# Patient Record
Sex: Male | Born: 1955 | Race: White | Hispanic: Yes | Marital: Married | Smoking: Never smoker
Health system: Southern US, Community
[De-identification: ages and names within clinical notes are randomized; demographics above are authoritative.]

## PROBLEM LIST (undated history)

## (undated) DIAGNOSIS — I10 Essential (primary) hypertension: Secondary | ICD-10-CM

## (undated) DIAGNOSIS — IMO0002 Reserved for concepts with insufficient information to code with codable children: Secondary | ICD-10-CM

## (undated) DIAGNOSIS — I517 Cardiomegaly: Secondary | ICD-10-CM

## (undated) DIAGNOSIS — R229 Localized swelling, mass and lump, unspecified: Secondary | ICD-10-CM

## (undated) DIAGNOSIS — R600 Localized edema: Secondary | ICD-10-CM

## (undated) DIAGNOSIS — M359 Systemic involvement of connective tissue, unspecified: Secondary | ICD-10-CM

## (undated) HISTORY — PX: NO PAST SURGERIES: SHX2092

## (undated) HISTORY — DX: Cardiomegaly: I51.7

## (undated) SURGERY — LAPAROSCOPIC CHOLECYSTECTOMY
Anesthesia: General

---

## 2003-10-08 ENCOUNTER — Emergency Department (HOSPITAL_COMMUNITY): Admission: EM | Admit: 2003-10-08 | Discharge: 2003-10-08 | Payer: Self-pay | Admitting: Emergency Medicine

## 2003-10-11 ENCOUNTER — Inpatient Hospital Stay (HOSPITAL_COMMUNITY): Admission: AD | Admit: 2003-10-11 | Discharge: 2003-10-14 | Payer: Self-pay | Admitting: General Surgery

## 2008-02-10 ENCOUNTER — Ambulatory Visit: Payer: Self-pay | Admitting: Cardiology

## 2010-11-02 NOTE — Procedures (Signed)
NAME:  George Douglas, George Douglas                   ACCOUNT NO.:  [id number]   MEDICAL RECORD NO.:  [id number]                   PATIENT TYPE:  INP   LOCATION:  A212                                 FACILITY:  [facility]   PHYSICIAN:  Vida Roller, M.D.                DATE OF BIRTH:  1955/12/17   DATE OF PROCEDURE:  DATE OF DISCHARGE:                                    STRESS TEST   INDICATION:  Mr. Lelon Perla is a 55 year old male with no known coronary  artery disease, atypical chest discomfort, and negative cardiac enzymes x3  for acute myocardial infarction.  He recently underwent treadmill test which  was a suboptimal test and revealed no ischemia, and good exercise tolerance.  He is having this test done for further evaluation.   BASELINE DATA:  EKG revealed sinus rhythm at 61 beats per minute with  nonspecific ST abnormalities.  Blood pressure 130/70.   DESCRIPTION OF PROCEDURE:  Adenosine __________ mg was infused over four  minute protocol with Cardiolite injected at three minutes.  The patient  reported chest discomfort which resolved in recovery.  EKG revealed no  ischemia and no arrhythmias.   FINAL IMAGES AND RESULTS:  Pending M.D. review.   The patient was consented using the [facility] hot line.     ________________________________________  ___________________________________________  Jae Dire, P.A. [facility]                      Vida Roller, M.D.   AB/MEDQ  D:  10/14/2003  T:  10/14/2003  Job:  [id number]

## 2010-11-02 NOTE — H&P (Signed)
NAME:  George Douglas, George Douglas                   ACCOUNT NO.:  [id number]   MEDICAL RECORD NO.:  [id number]                   PATIENT TYPE:  INP   LOCATION:  A212                                 FACILITY:  [facility]   PHYSICIAN:  Dirk Dress. Katrinka Blazing, M.D.                DATE OF BIRTH:  1955-08-28   DATE OF ADMISSION:  10/11/2003  DATE OF DISCHARGE:                                HISTORY & PHYSICAL   HISTORY OF PRESENT ILLNESS:  A 55 year old married [place] male admitted for  evaluation of chest pain.  The patient has greater than a six month history  of chest pain.  He describes this as episodes of pressure that occur at  varying times.  He does not have diaphoresis, nausea, nor does he have  shortness of breath.  The pain is not made worse with exertion.  The  severity of the pain is not more intense or any more frequent over the past  six months.  He does have associated weakness in his hips and legs which  occurs whenever the pressure-like sensation occurs.  The pain has occurred  daily over the last two days, and it last six hours two days ago, it lasted  about 2-3 hours yesterday.   The patient presented to the office after evaluation in the emergency room.  His main concern is that he has cancer because of his weakness.  There is no  history of weight loss.  He is not anorexic.  He has not had any change in  bowel habits.  He has not noted any increasing lymph nodes, swelling or  tenderness of his muscles.  Because of the concern of possible angina, the  patient is admitted.  There is difficulty because of poor interpretation  because he speaks no [place], and all information is obtained through his  son who serves as an Equities trader.   He has not had any known medical illness. He rarely sees a physician.  He  has only been in the [place] for about six months.  He did see a  physician in [place] and was treated for elevated cholesterol.  He is not on  any chronic medication.  He has  had no known illness as a child.  He has had  no previous hospitalizations.  He has had no surgery.  His only medication  is over-the-counter Tylenol.   ALLERGIES:  There is no history of allergies.   SOCIAL HISTORY:  He is married.  [place]  working as an Cytogeneticist.  He resides with his son who speaks  [place] fairly well.  He does not smoke or use street drugs.  He has a  history of alcohol abuse in the past, but he has used very little alcohol in  the past four years, and he has used none in the past year.  He is the  father of seven children,  which consists of four sons and three daughters,  all of whom are healthy.   FAMILY HISTORY:  His father died at age 69 of nasal infection.  Mother died  at age 61 of natural causes.  He has six siblings, all of whom he feels are  in good health.  There is no known history of heart disease in any of his  siblings.   REVIEW OF SYSTEMS:  Unremarkable except for fear of cancer, and some  numbness and tingling of his legs.  He denies chest pain, shortness of  breath, diarrhea, constipation, headaches or dizziness.   PHYSICAL EXAMINATION:  GENERAL:  He is a healthy-appearing, Hispanic male,  in no acute distress.  VITAL SIGNS:  Blood pressure 115/71, pulse 68, respirations 18, temperature  97.6.  HEENT:  Pupils are equal and reactive.  Extraocular movements are full.  Oral mucosa is normal.  NECK:  Supple, no JVD, bruits or adenopathy.  CHEST:  Clear to auscultation, no rales, rubs, rhonchi or wheezes.  HEART:  Regular rate and rhythm, without murmur, gallop or rub.  ABDOMEN:  Mildly obese, soft, nontender except for suggestion of tenderness  in the deep right lower quadrant.  No masses are felt.  EXTREMITIES:  Good femoral, popliteal, dorsalis pedis and posterior tibial  pulses.  Very good capillary refill.  No skin changes in his feet or legs.  There is no loss of hair in the distal aspect of his  legs.  NEUROLOGIC:  On exam, he is alert and oriented, Cranial nerves are intact.  He has symmetric motor and sensory in all extremities.  Cerebellar appears  to be intact.   IMPRESSION:  1. Left chest pain, recurrent with crescendo-type pattern, etiology     undetermined, rule out unstable angina.  2. Diffuse weakness including lower-extremity weakness which may be related     to low-back discomfort.  This will have to be evaluated.  There is no     evidence of any clinical peripheral vascular disease.  There is no     history to suggest claudication.  3. Questionable history of hyperlipidemia, to be evaluated.   PLAN:  1. The patient will be evaluated by cardiology.  A stress test will be done.     If this is negative for ischemia, we will do the remaining portion of the     workup as an outpatient, and he will be treated symptomatically.  2. Thyroid functions have been ordered to try to evaluate him for what     appears to be fatigue.  3. We will also proceed with x-rays of his lumbar spine, looking for     questionable lumbar disk disease which may explain his buttocks and upper     leg discomfort.     ___________________________________________                                         Dirk Dress. Katrinka Blazing, M.D.   LCS/MEDQ  D:  10/11/2003  T:  10/11/2003  Job:  [id number]

## 2010-11-02 NOTE — Procedures (Signed)
NAME:  George Douglas, George Douglas                   ACCOUNT NO.:  [id number]   MEDICAL RECORD NO.:  [id number]                   PATIENT TYPE:  INP   LOCATION:  A212                                 FACILITY:  [facility]   PHYSICIAN:  [place] Bing, M.D.               DATE OF BIRTH:  Jan 10, 1956   DATE OF PROCEDURE:  10/12/2003  DATE OF DISCHARGE:                                    STRESS TEST   PROCEDURE:  Graded exercise test.   REFERRING PHYSICIAN:  Dirk Dress. Katrinka Blazing, M.D.   CLINICAL DATA:  A 55 year old gentleman admitted to hospital with atypical  chest pain; a few cardiovascular risk factors.  1. Upright treadmill exercise to a workload of 12 METs and a heart rate of     135, 79% of age-predicted maximum.  Exercise discontinued due to fatigue.     No chest pain reported.  2. Blood pressure increased from a resting value of 120/75 to 180/80 at peak     exercise, a normal response.  3. No important arrhythmias--a few PACs.  4. Baseline EKG:  Normal sinus rhythm; within normal limits.   STRESS EKG:  Insignificant upsloping ST segment depression.   IMPRESSION:  Negative, albeit somewhat maximal, stress test revealing good  exercise tolerance.  No exercise--induced chest discomfort and no  electrocardiographic changes to suggest myocardial ischemia.  Other findings  as noted.      ___________________________________________                                            [place] Bing, M.D.   RR/MEDQ  D:  10/12/2003  T:  10/13/2003  Job:  [id number]

## 2010-11-02 NOTE — Discharge Summary (Signed)
NAME:  George Douglas, George Douglas                   ACCOUNT NO.:  [id number]   MEDICAL RECORD NO.:  [id number]                   PATIENT TYPE:  INP   LOCATION:  A212                                 FACILITY:  [facility]   PHYSICIAN:  Dirk Dress. Katrinka Blazing, M.D.                DATE OF BIRTH:  [birth year]   DATE OF ADMISSION:  10/11/2003  DATE OF DISCHARGE:  10/14/2003                                 DISCHARGE SUMMARY   DISCHARGE DIAGNOSES:  1. Atypical chest pain.  2. Chronic low back pain.   DISPOSITION:  The patient is discharged home in stable, satisfactory  condition.   DISCHARGE MEDICATIONS:  1. Ultram 50 mg q.i.d. p.r.n. back pain.  2. Aspirin 81 mg q.d.   The patient is scheduled to be seen in the office one week post discharge.   SUMMARY:  This is a 55 year old Hispanic male who is admitted for evaluation  of chest pain.  He gives greater than a six month history of pain, which he  describes as episodes of pressure that occur at varying times.  The pain is  not made worse with exertion.  The severity of the pain is not more intense  or any more frequent over the past six months.  He has weakness in his hips  and legs.  Because of concern of possible angina, the patient is admitted.  Other history is given in the admission note.  It was felt that the patient  had atypical chest pain and mild low back pain, but because of language  difficulties with inability to fully understand, it was felt that it would  be in his best interest that he be admitted and evaluated.  Cardiac enzymes  were negative for myocardial ischemia or injury times three.  Graded  exercise stress test was negative for ischemia.  Adenosine stress Cardiolite  was negative with normal perfusion and normal left ventricular ejection  fraction.  It was felt that he had no evidence of coronary artery disease.  He was seen in consultation by cardiology.  The patient was therefore  discharged home and advised to have regular  follow-up in the office.     ___________________________________________                                         Dirk Dress. Katrinka Blazing, M.D.   LCS/MEDQ  D:  11/20/2003  T:  11/21/2003  Job:  [id number]

## 2014-08-02 ENCOUNTER — Emergency Department: Payer: Self-pay | Admitting: Emergency Medicine

## 2017-01-01 ENCOUNTER — Other Ambulatory Visit: Payer: Self-pay | Admitting: Family Medicine

## 2017-01-01 DIAGNOSIS — R221 Localized swelling, mass and lump, neck: Secondary | ICD-10-CM

## 2017-01-03 ENCOUNTER — Ambulatory Visit
Admission: RE | Admit: 2017-01-03 | Discharge: 2017-01-03 | Disposition: A | Discharge status: Home / Self Care | Payer: BLUE CROSS/BLUE SHIELD | Source: Ambulatory Visit | Attending: Family Medicine | Admitting: Family Medicine

## 2017-01-03 DIAGNOSIS — R221 Localized swelling, mass and lump, neck: Secondary | ICD-10-CM | POA: Diagnosis not present

## 2017-01-09 ENCOUNTER — Other Ambulatory Visit: Payer: Self-pay | Admitting: Family Medicine

## 2017-01-09 DIAGNOSIS — R221 Localized swelling, mass and lump, neck: Secondary | ICD-10-CM

## 2017-01-16 ENCOUNTER — Ambulatory Visit
Admission: RE | Admit: 2017-01-16 | Discharge: 2017-01-16 | Disposition: A | Discharge status: Home / Self Care | Payer: BLUE CROSS/BLUE SHIELD | Source: Ambulatory Visit | Attending: Family Medicine | Admitting: Family Medicine

## 2017-01-16 DIAGNOSIS — R221 Localized swelling, mass and lump, neck: Secondary | ICD-10-CM | POA: Diagnosis not present

## 2017-01-16 HISTORY — DX: Systemic involvement of connective tissue, unspecified: M35.9

## 2017-01-16 LAB — POCT I-STAT CREATININE: CREATININE: 0.6 mg/dL — AB (ref 0.61–1.24)

## 2017-01-16 MED ORDER — IOPAMIDOL (ISOVUE-370) INJECTION 76%
75.0000 mL | Freq: Once | INTRAVENOUS | Status: AC | PRN
  Administered 2017-01-16: 75 mL via INTRAVENOUS

## 2017-02-03 ENCOUNTER — Emergency Department: Payer: BLUE CROSS/BLUE SHIELD

## 2017-02-03 ENCOUNTER — Encounter: Payer: Self-pay | Admitting: Emergency Medicine

## 2017-02-03 ENCOUNTER — Emergency Department
Admission: EM | Admit: 2017-02-03 | Discharge: 2017-02-04 | Disposition: A | Discharge status: Home / Self Care | Payer: BLUE CROSS/BLUE SHIELD | Attending: Emergency Medicine | Admitting: Emergency Medicine

## 2017-02-03 DIAGNOSIS — K429 Umbilical hernia without obstruction or gangrene: Secondary | ICD-10-CM | POA: Diagnosis not present

## 2017-02-03 DIAGNOSIS — K8020 Calculus of gallbladder without cholecystitis without obstruction: Secondary | ICD-10-CM

## 2017-02-03 DIAGNOSIS — R109 Unspecified abdominal pain: Secondary | ICD-10-CM | POA: Diagnosis present

## 2017-02-03 DIAGNOSIS — I10 Essential (primary) hypertension: Secondary | ICD-10-CM | POA: Diagnosis not present

## 2017-02-03 HISTORY — DX: Essential (primary) hypertension: I10

## 2017-02-03 LAB — CBC
HCT: 43.4 % (ref 40.0–52.0)
Hemoglobin: 14.9 g/dL (ref 13.0–18.0)
MCH: 31.9 pg (ref 26.0–34.0)
MCHC: 34.3 g/dL (ref 32.0–36.0)
MCV: 92.9 fL (ref 80.0–100.0)
PLATELETS: 164 10*3/uL (ref 150–440)
RBC: 4.68 MIL/uL (ref 4.40–5.90)
RDW: 13.9 % (ref 11.5–14.5)
WBC: 5.4 10*3/uL (ref 3.8–10.6)

## 2017-02-03 LAB — COMPREHENSIVE METABOLIC PANEL
ALT: 18 U/L (ref 17–63)
AST: 25 U/L (ref 15–41)
Albumin: 4 g/dL (ref 3.5–5.0)
Alkaline Phosphatase: 68 U/L (ref 38–126)
Anion gap: 9 (ref 5–15)
BUN: 9 mg/dL (ref 6–20)
CHLORIDE: 102 mmol/L (ref 101–111)
CO2: 29 mmol/L (ref 22–32)
CREATININE: 0.77 mg/dL (ref 0.61–1.24)
Calcium: 9.2 mg/dL (ref 8.9–10.3)
GFR calc Af Amer: >60 mL/min (ref >60)
GLUCOSE: 97 mg/dL (ref 65–99)
Potassium: 3.9 mmol/L (ref 3.5–5.1)
Sodium: 140 mmol/L (ref 135–145)
Total Bilirubin: 0.7 mg/dL (ref 0.3–1.2)
Total Protein: 7.7 g/dL (ref 6.5–8.1)

## 2017-02-03 LAB — LIPASE, BLOOD: LIPASE: 26 U/L (ref 11–51)

## 2017-02-03 MED ORDER — ONDANSETRON HCL 4 MG/2ML IJ SOLN
4.0000 mg | Freq: Once | INTRAMUSCULAR | Status: AC
  Administered 2017-02-03: 4 mg via INTRAVENOUS
  Filled 2017-02-03: qty 2

## 2017-02-03 MED ORDER — IOPAMIDOL (ISOVUE-300) INJECTION 61%
100.0000 mL | Freq: Once | INTRAVENOUS | Status: AC | PRN
  Administered 2017-02-04: 100 mL via INTRAVENOUS

## 2017-02-03 MED ORDER — MORPHINE SULFATE (PF) 4 MG/ML IV SOLN
4.0000 mg | Freq: Once | INTRAVENOUS | Status: AC
  Administered 2017-02-03: 4 mg via INTRAVENOUS
  Filled 2017-02-03: qty 1

## 2017-02-03 MED ORDER — SODIUM CHLORIDE 0.9 % IV BOLUS (SEPSIS)
1000.0000 mL | Freq: Once | INTRAVENOUS | Status: AC
  Administered 2017-02-04: 1000 mL via INTRAVENOUS

## 2017-02-03 NOTE — ED Triage Notes (Signed)
Patient reports abdominal pain above umbilicus for years, but pain worse today.  Reports history of hernia.  Denies nausea or vomiting.

## 2017-02-03 NOTE — ED Provider Notes (Addendum)
[facility] Emergency Department Provider Note   ____________________________________________   First MD Initiated Contact with Patient 02/03/17 2329     (approximate)  I have reviewed the triage vital signs and the nursing notes.   HISTORY  Chief Complaint Abdominal Pain    HPI George Douglas is a 61 y.o. male who comes into the hospital today with some abdominal pain. The patient reports that he has a hernia in his abdomen and is currently painful. He reports it started earlier today. The patient denies any nausea vomiting or fever he also denies any diarrhea or constipation. He didn't take anything for pain and rates his pain a 10 out of 10 in intensity. He reports that he's had pain in this area in the past but today is just very severe. The patient denies any chest pain or shortness of breath. He didn't take his blood pressure medicines today. He couldn't tolerate the pain so he decided to come into the hospital for evaluation. He also reports that his right eye has been sick over the last 2 days.   Past Medical History:  Diagnosis Date  . Collagen vascular disease ([place])   . Hypertension     There are no active problems to display for this patient.   History reviewed. No pertinent surgical history.  Prior to Admission medications   Medication Sig Start Date End Date Taking? Authorizing Provider  ondansetron (ZOFRAN ODT) 4 MG disintegrating tablet Take 1 tablet (4 mg total) by mouth every 8 (eight) hours as needed for nausea or vomiting. 02/04/17   Loney Hering, MD  oxyCODONE-acetaminophen (ROXICET) 5-325 MG tablet Take 1 tablet by mouth every 6 (six) hours as needed. 02/04/17   Loney Hering, MD    Allergies Patient has no known allergies.  No family history on file.  Social History Social History  Substance Use Topics  . Smoking status: Never Smoker  . Smokeless tobacco: Not on file  . Alcohol use Yes    Review of  Systems  Constitutional: No fever/chills Eyes: right eye redness ENT: No sore throat. Cardiovascular: Denies chest pain. Respiratory: Denies shortness of breath. Gastrointestinal: abdominal pain.  No nausea, no vomiting.  No diarrhea.  No constipation. Genitourinary: Negative for dysuria. Musculoskeletal: Negative for back pain. Skin: Negative for rash. Neurological: Negative for headaches, focal weakness or numbness.   ____________________________________________   PHYSICAL EXAM:  VITAL SIGNS: ED Triage Vitals  Enc Vitals Group     BP 02/03/17 1957 (!) 147/71     Pulse Rate 02/03/17 1957 63     Resp 02/03/17 1957 20     Temp 02/03/17 1957 98.1 F (36.7 C)     Temp Source 02/03/17 1957 Oral     SpO2 02/03/17 1957 94 %     Weight 02/03/17 2002 197 lb (89.4 kg)     Height 02/03/17 2002 5\' 6"  (1.676 m)     Head Circumference --      Peak Flow --      Pain Score 02/03/17 2002 10     Pain Loc --      Pain Edu? --      Excl. in [place]? --     Constitutional: Alert and oriented. Well appearing and in moderate distress. Eyes: Conjunctivae injected on right and sclera erythematous. PERRL. EOMI. Head: Atraumatic. Nose: No congestion/rhinnorhea. Mouth/Throat: Mucous membranes are moist.  Oropharynx non-erythematous. Cardiovascular: Normal rate, regular rhythm. Systolic murmur auscultated.  Good peripheral circulation. Respiratory: Normal  respiratory effort.  No retractions. Lungs CTAB. Gastrointestinal: Soft with some mid abd tenderness to palpation and an umbilical hernia that is firm and tender to palpation. The patient pushed my hand away as I palpated the area, distention. Positive bowel sounds Musculoskeletal: No lower extremity tenderness nor edema.   Neurologic:  Normal speech and language.  Skin:  Skin is warm, dry and intact.  Psychiatric: Mood and affect are normal.   ____________________________________________   LABS (all labs ordered are listed, but only abnormal  results are displayed)  Labs Reviewed  URINALYSIS, COMPLETE (UACMP) WITH MICROSCOPIC - Abnormal; Notable for the following:       Result Value   Color, Urine STRAW (*)    APPearance CLEAR (*)    Specific Gravity, Urine 1.004 (*)    All other components within normal limits  LIPASE, BLOOD  COMPREHENSIVE METABOLIC PANEL  CBC  ETHANOL  LACTIC ACID, PLASMA   ____________________________________________  EKG  ED ECG REPORT I, Loney Hering, the attending physician, personally viewed and interpreted this ECG.   Date: 02/03/2017  EKG Time: 2009  Rate: 65  Rhythm: normal sinus rhythm  Axis: normal  Intervals:none  ST&T Change: none  ____________________________________________  RADIOLOGY  Ct Abdomen Pelvis W Contrast  Result Date: 02/04/2017 CLINICAL DATA:  Abdominal distension. Acute on chronic periumbilical pain. EXAM: CT ABDOMEN AND PELVIS WITH CONTRAST TECHNIQUE: Multidetector CT imaging of the abdomen and pelvis was performed using the standard protocol following bolus administration of intravenous contrast. CONTRAST:  129mL ISOVUE-300 IOPAMIDOL (ISOVUE-300) INJECTION 61% COMPARISON:  Scrotal sonogram August 02, 2014 FINDINGS: LOWER CHEST: Pleural thickening LEFT lung base. The included heart is moderately enlarged. No pericardial effusions. HEPATOBILIARY: Numerous tiny stones within the gallbladder neck versus proximal cystic duct. No CT findings of acute cholecystitis. Normal CT liver. PANCREAS: Normal. SPLEEN: Normal. ADRENALS/URINARY TRACT: Kidneys are orthotopic, demonstrating symmetric enhancement. Focal cortical scarring RIGHT interpolar kidney. No nephrolithiasis, hydronephrosis or solid renal masses. The unopacified ureters are normal in course and caliber. Delayed imaging through the kidneys demonstrates symmetric prompt contrast excretion within the proximal urinary collecting system. Urinary bladder is partially distended and unremarkable. Normal adrenal glands.  STOMACH/BOWEL: The stomach, small and large bowel are normal in course and caliber without inflammatory changes. Normal appendix. VASCULAR/LYMPHATIC: Aortoiliac vessels are normal in course and caliber. Mild calcific atherosclerosis. No lymphadenopathy by CT size criteria. REPRODUCTIVE: Partially imaged LEFT scrotal hydrocele. Prostate size is normal. OTHER: No intraperitoneal free fluid or free air. MUSCULOSKELETAL: Nonacute. 3.2 cm neck small to moderate umbilical hernia containing fat and inflammatory changes. No bowel within hernia sac. Mild L5-S1 degenerative disc with vacuum phenomena. IMPRESSION: 1. Small to moderate fat containing umbilical hernia with fat necrosis. 2. Cholelithiasis versus choledocholithiasis without acute cholecystitis. 3. Moderate cardiomegaly.  Recommend nonemergent chest radiograph. Aortic Atherosclerosis (ICD10-I70.0). Electronically Signed   By: Elon Alas M.D.   On: 02/04/2017 00:58   US Abdomen Limited Ruq  Result Date: 02/04/2017 CLINICAL DATA:  Supraumbilical abdominal pain times several years worsening today. EXAM: ULTRASOUND ABDOMEN LIMITED RIGHT UPPER QUADRANT COMPARISON:  Same day CT of the abdomen and pelvis FINDINGS: Gallbladder: Nonobstructing gallstones are noted without secondary signs of acute cholecystitis. The largest calculus is approximate 8-9 mm. Single wall thickness of the gallbladder is normal at 2.2 mm. There is no pericholecystic. No sonographic Murphy sign noted by sonographer. Common bile duct: Diameter: Dilated up to 9 mm without evidence of choledocholithiasis. Liver: No focal lesion identified. Within normal limits in parenchymal echogenicity.  Portal vein is patent on color Doppler imaging with normal direction of blood flow towards the liver. IMPRESSION: Uncomplicated cholelithiasis. Ectatic appearance of the common bile duct without stones. Electronically Signed   By: Ashley Royalty M.D.   On: 02/04/2017 02:28     ____________________________________________   PROCEDURES  Procedure(s) performed: None  Procedures  Critical Care performed: No  ____________________________________________   INITIAL IMPRESSION / ASSESSMENT AND PLAN / ED COURSE  Pertinent labs & imaging results that were available during my care of the patient were reviewed by me and considered in my medical decision making (see chart for details).  This is a 61 year old male who comes into the hospital today with abdominal pain and hernia. The patient reports that he's been having this pain minimally on and off for some time but it's worse all day today. I will give the patient a dose of morphine and Zofran. I will also give him a liter of normal saline and I will send the patient for a CT scan of his abdomen looking for possible incarceration of his hernia. The patient's blood work is unremarkable.     The patient has a fat-containing hernia with some necrosis. He also has some gallstones. Otherwise the patient's blood work is unremarkable. I did give the patient a dose of Percocet for pain. Otherwise the patient has no other complaints. He states that his pain has some improvement. He will be discharged home to follow-up with surgery or he reports he has an appointment in [facility] where he may also attempt to follow-up. He'll be discharged to home. ____________________________________________   FINAL CLINICAL IMPRESSION(S) / ED DIAGNOSES  Final diagnoses:  Abdominal pain  Umbilical hernia without obstruction and without gangrene  Calculus of gallbladder without cholecystitis without obstruction      NEW MEDICATIONS STARTED DURING THIS VISIT:  New Prescriptions   ONDANSETRON (ZOFRAN ODT) 4 MG DISINTEGRATING TABLET    Take 1 tablet (4 mg total) by mouth every 8 (eight) hours as needed for nausea or vomiting.   OXYCODONE-ACETAMINOPHEN (ROXICET) 5-325 MG TABLET    Take 1 tablet by mouth every 6 (six) hours as  needed.     Note:  This document was prepared using Dragon voice recognition software and may include unintentional dictation errors.    Loney Hering, MD 02/04/17 0254    Loney Hering, MD 02/04/17 [phone]

## 2017-02-03 NOTE — ED Notes (Signed)
Interpreter request put in; pt denies speaking or understanding any English.

## 2017-02-04 ENCOUNTER — Emergency Department: Payer: BLUE CROSS/BLUE SHIELD

## 2017-02-04 ENCOUNTER — Telehealth: Payer: Self-pay

## 2017-02-04 LAB — URINALYSIS, COMPLETE (UACMP) WITH MICROSCOPIC
BILIRUBIN URINE: NEGATIVE
Bacteria, UA: NONE SEEN
Glucose, UA: NEGATIVE mg/dL
HGB URINE DIPSTICK: NEGATIVE
KETONES UR: NEGATIVE mg/dL
LEUKOCYTES UA: NEGATIVE
NITRITE: NEGATIVE
Protein, ur: NEGATIVE mg/dL
SPECIFIC GRAVITY, URINE: 1.004 — AB (ref 1.005–1.030)
Squamous Epithelial / LPF: NONE SEEN
pH: 7 (ref 5.0–8.0)

## 2017-02-04 LAB — LACTIC ACID, PLASMA: LACTIC ACID, VENOUS: 1.1 mmol/L (ref 0.5–1.9)

## 2017-02-04 LAB — ETHANOL

## 2017-02-04 MED ORDER — ONDANSETRON 4 MG PO TBDP: 4.0000 mg | ORAL_TABLET | Freq: Three times a day (TID) | ORAL | 0 refills | Status: DC | PRN

## 2017-02-04 MED ORDER — OXYCODONE-ACETAMINOPHEN 5-325 MG PO TABS: 1.0000 | ORAL_TABLET | Freq: Four times a day (QID) | ORAL | 0 refills | Status: DC | PRN

## 2017-02-04 MED ORDER — OXYCODONE-ACETAMINOPHEN 5-325 MG PO TABS
2.0000 | ORAL_TABLET | Freq: Once | ORAL | Status: AC
  Administered 2017-02-04: 2 via ORAL
  Filled 2017-02-04: qty 2

## 2017-02-04 NOTE — ED Notes (Signed)
Discharge instructions reviewed using [employer] Interpreter. Pt verbalized understanding of the instructions, follow up and home care and prescription medication administration.

## 2017-02-04 NOTE — ED Notes (Signed)

## 2017-02-04 NOTE — Telephone Encounter (Signed)
Called patient and left him a voicemail letting him know that we were calling to schedule him a consult with our surgeon to be seen for his cholelithiasis and umbilical hernia. Awaiting on his call.

## 2017-02-05 NOTE — Telephone Encounter (Signed)
Called patient again and left him a voicemail.

## 2017-02-10 ENCOUNTER — Other Ambulatory Visit: Payer: Self-pay

## 2017-02-12 ENCOUNTER — Ambulatory Visit (INDEPENDENT_AMBULATORY_CARE_PROVIDER_SITE_OTHER): Payer: BLUE CROSS/BLUE SHIELD | Admitting: Surgery

## 2017-02-12 VITALS — BP 136/73 | HR 77 | Temp 98.2°F | Ht 64.0 in | Wt 216.0 lb

## 2017-02-12 DIAGNOSIS — K8020 Calculus of gallbladder without cholecystitis without obstruction: Secondary | ICD-10-CM | POA: Diagnosis not present

## 2017-02-12 DIAGNOSIS — I517 Cardiomegaly: Secondary | ICD-10-CM | POA: Diagnosis not present

## 2017-02-12 DIAGNOSIS — R1033 Periumbilical pain: Secondary | ICD-10-CM

## 2017-02-12 NOTE — Patient Instructions (Addendum)
Yo le llamare cuando tenga su cita para el ECHOcardiograma y con el Dr. Caroleen Hamman.  El Dr. Dahlia Byes quiere que usted vea al Urologo para un hidrocele en el testiculo izquierdo.

## 2017-02-13 ENCOUNTER — Encounter: Payer: Self-pay | Admitting: Surgery

## 2017-02-13 NOTE — Progress Notes (Signed)
Surgical Consultation  02/13/2017  George Douglas George Douglas is an 61 y.o. male.   Chief Complaint  Patient presents with  . Follow-up    ED Umbilical pain    HPI: Patient seen at the request of Dr. Dahlia Client. He comes here to the office for follow-up regarding a symptomatic umbilical hernia. He reports that he has had this hernia for several years but about a week ago went to the emergency room with severe pain around the umbilical area. He reported the pain was sharp and located only around the periumbilical area. Some decrease in appetite. No fevers no chills no emesis. No chest pain. I have personally reviewed his CT scan of the abdomen showing evidence of moderate umbilical hernia with some fat necrosis. There is also evidence of cholelithiasis and significant cardiomegaly. Ultrasound also reviewed showing evidence of stones and a dilated common bile duct. His LFTs were normal as well as his CBC. 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  Past Medical History:  Diagnosis Date  . Cardiomegaly   . Collagen vascular disease ([place])   . Hypertension     Past Surgical History:  Procedure Laterality Date  . NO PAST SURGERIES      Family History  Problem Relation Age of Onset  . Arthritis Mother     Social History:  reports that he has never smoked. He has never used smokeless tobacco. He reports that he drinks alcohol. He reports that he does not use drugs.  Allergies: No Known Allergies  Medications reviewed.     ROS Full ROS performed and is otherwise negative other than what is stated in the HPI    BP 136/73   Pulse 77   Temp 98.2 F (36.8 C) (Oral)   Ht 5\' 4"  (1.626 m)   Wt 98 kg (216 lb)   BMI 37.08 kg/m   Physical Exam  Constitutional: He is oriented to person, place, and time and well-developed, well-nourished, and in no distress. No distress.  Eyes:  Conjunctivae and EOM are normal. Right eye exhibits no discharge. Left eye exhibits no discharge. No scleral icterus.  Neck: Normal range of motion. No JVD present. No tracheal deviation present. No thyromegaly present.  Cardiovascular: Normal rate and normal heart sounds.   Pulmonary/Chest: Effort normal. No stridor. No respiratory distress. He has no wheezes. He has no rales. He exhibits no tenderness.  Abdominal: Soft. He exhibits no distension. There is no tenderness. There is no rebound and no guarding.  Large reducible UH  Genitourinary:  Genitourinary Comments: Large left hydrocele. Difficult to assess the  presence of an inguinal hernia   Lymphadenopathy:    He has no cervical adenopathy.  Neurological: He is alert and oriented to person, place, and time. Gait normal. GCS score is 15.  Skin: Skin is warm and dry.  Psychiatric: Mood, memory, affect and judgment normal.  Nursing note and vitals reviewed.      Assessment/Plan: 60 year old with multiple issues and my #1 is a symptomatic large umbilical hernia. He also has what appears to be symptomatic cholelithiasis with questionable choledocholithiasis. First order of business is to rule out any evidence of biliary obstruction with an MRCP and MRI also to look at any potential masses of the head of the pancreas. He is not septic is not behaving like biliary obstruction or cholangitis and there is no need  for immediate surgical intervention. I'll also order an echocardiogram to have a baseline ID LV dysfunction of the heart given his massive heart seen on the CT scan. Clinically there is no clear-cut cardiac symptoms. We'll also taking a urological consultation given that the patient has a significant large and symptomatic hydrocele on the left side I spent at least 60 minutes in this encounter with the majority of time spent in counseling and coordination of care  Caroleen Hamman, MD [place] [facility]

## 2017-02-19 ENCOUNTER — Ambulatory Visit (HOSPITAL_BASED_OUTPATIENT_CLINIC_OR_DEPARTMENT_OTHER)
Admission: RE | Admit: 2017-02-19 | Discharge: 2017-02-19 | Disposition: A | Discharge status: Home / Self Care | Payer: BLUE CROSS/BLUE SHIELD | Source: Ambulatory Visit | Attending: Surgery | Admitting: Surgery

## 2017-02-19 ENCOUNTER — Ambulatory Visit
Admission: RE | Admit: 2017-02-19 | Discharge: 2017-02-19 | Disposition: A | Discharge status: Home / Self Care | Payer: BLUE CROSS/BLUE SHIELD | Source: Ambulatory Visit | Attending: Surgery | Admitting: Surgery

## 2017-02-19 DIAGNOSIS — I110 Hypertensive heart disease with heart failure: Secondary | ICD-10-CM | POA: Insufficient documentation

## 2017-02-19 DIAGNOSIS — K8020 Calculus of gallbladder without cholecystitis without obstruction: Secondary | ICD-10-CM | POA: Insufficient documentation

## 2017-02-19 DIAGNOSIS — J45909 Unspecified asthma, uncomplicated: Secondary | ICD-10-CM | POA: Diagnosis not present

## 2017-02-19 DIAGNOSIS — R1033 Periumbilical pain: Secondary | ICD-10-CM | POA: Insufficient documentation

## 2017-02-19 DIAGNOSIS — I352 Nonrheumatic aortic (valve) stenosis with insufficiency: Secondary | ICD-10-CM | POA: Insufficient documentation

## 2017-02-19 DIAGNOSIS — I509 Heart failure, unspecified: Secondary | ICD-10-CM | POA: Insufficient documentation

## 2017-02-19 DIAGNOSIS — I517 Cardiomegaly: Secondary | ICD-10-CM

## 2017-02-19 MED ORDER — GADOBENATE DIMEGLUMINE 529 MG/ML IV SOLN
20.0000 mL | Freq: Once | INTRAVENOUS | Status: AC | PRN
  Administered 2017-02-19: 20 mL via INTRAVENOUS

## 2017-02-19 NOTE — Progress Notes (Signed)
*  PRELIMINARY RESULTS* Echocardiogram 2D Echocardiogram has been performed.  George Douglas 02/19/2017, 10:13 AM

## 2017-02-24 ENCOUNTER — Encounter: Payer: Self-pay | Admitting: Urology

## 2017-02-24 ENCOUNTER — Ambulatory Visit (INDEPENDENT_AMBULATORY_CARE_PROVIDER_SITE_OTHER): Payer: BLUE CROSS/BLUE SHIELD | Admitting: Urology

## 2017-02-24 VITALS — BP 133/69 | HR 72 | Ht 64.0 in | Wt 215.3 lb

## 2017-02-24 DIAGNOSIS — N50819 Testicular pain, unspecified: Secondary | ICD-10-CM | POA: Diagnosis not present

## 2017-02-24 DIAGNOSIS — N433 Hydrocele, unspecified: Secondary | ICD-10-CM

## 2017-02-24 LAB — URINALYSIS, COMPLETE
BILIRUBIN UA: NEGATIVE
GLUCOSE, UA: NEGATIVE
Ketones, UA: NEGATIVE
Leukocytes, UA: NEGATIVE
Nitrite, UA: NEGATIVE
PH UA: 5.5 (ref 5.0–7.5)
PROTEIN UA: NEGATIVE
Urobilinogen, Ur: 0.2 mg/dL (ref 0.2–1.0)

## 2017-02-24 NOTE — Progress Notes (Signed)
02/24/2017 12:01 PM   George Douglas [id number]  Referring provider: Jules Douglas, George Douglas, George Douglas  Chief Complaint  Patient presents with  . Testicle Pain  . Nephrolithiasis    HPI: 61 year old male with long-standing left hydrocele who is seen today for further evaluation and management options, referred by George Douglas, M.D.  The patient states that his hydroceles been present for approximately 4 years, or more. He has had intermittent left testicular pain associated with her. He first noted pain approximately 18 months ago. He has not really had any significant treatment for his pain. It is mostly tender to touch. His hydrocele has been stable for Douglas least 2 years without significant growth. He was seen in [place] for this and was told that he needed to lose weight prior to any surgical intervention. The patient denies any significant urinary tract symptoms including frequency or urgency. He has not had any fevers or chills. He has no history of urinary tract infections or prostatitis.  The patient also has a large symptomatic umbilical hernia. He is scheduled to follow-up with general surgery and likely have surgery scheduled for repair.   The patient has undergone a CT scan of the abdomen and pelvis which demonstrated a large fat-containing umbilical hernia with some inflammation. It also demonstrated a left-sided hydrocele. He subsequent underwent an MRI to ensure that the patient had no evidence of common bile duct obstruction from his choledocholithiasis. The MRI was largely unremarkable except for the similar findings on CT scan.  PMH: Past Medical History:  Diagnosis Date  . Cardiomegaly   . Collagen vascular disease ([place])   . Hypertension     Surgical History: Past Surgical History:  Procedure Laterality Date  . NO PAST SURGERIES      Home Medications:  Allergies as of 02/24/2017   No Known Allergies       Medication List    as of 02/24/2017 12:01 PM   You have not been prescribed any medications.          Discharge Care Instructions        Start     Ordered   02/24/17 0000  Urinalysis, Complete     02/24/17 0844      Allergies: No Known Allergies  Family History: Family History  Problem Relation Age of Onset  . Arthritis Mother   . Prostate cancer Neg Hx   . Bladder Cancer Neg Hx   . Kidney cancer Neg Hx     Social History:  reports that he has never smoked. He has never used smokeless tobacco. He reports that he drinks alcohol. He reports that he does not use drugs.  ROS: UROLOGY Frequent Urination?: Yes Hard to postpone urination?: No Burning/pain with urination?: Yes Get up Douglas night to urinate?: Yes Leakage of urine?: No Urine stream starts and stops?: Yes Trouble starting stream?: No Do you have to strain to urinate?: No Blood in urine?: No Urinary tract infection?: No Sexually transmitted disease?: No Injury to kidneys or bladder?: No Painful intercourse?: No Weak stream?: Yes Erection problems?: Yes Penile pain?: No  Gastrointestinal Nausea?: No Vomiting?: No Indigestion/heartburn?: No Diarrhea?: No Constipation?: No  Constitutional Fever: No Night sweats?: No Weight loss?: No Fatigue?: No  Skin Skin rash/lesions?: No Itching?: No  Eyes Blurred vision?: No Double vision?: No  Ears/Nose/Throat Sore throat?: Yes Sinus problems?: No  Hematologic/Lymphatic Swollen glands?: No Easy bruising?: No  Cardiovascular Leg swelling?:  No Chest pain?: No  Respiratory Cough?: Yes Shortness of breath?: No  Endocrine Excessive thirst?: Yes  Musculoskeletal Back pain?: No Joint pain?: No  Neurological Headaches?: Yes Dizziness?: Yes  Psychologic Depression?: No Anxiety?: No  Physical Exam: BP 133/69 (BP Location: Left Arm, Patient Position: Sitting, Cuff Size: Normal)   Pulse 72   Ht 5\' 4"  (1.626 m)   Wt 97.7 kg (215 lb  4.8 oz)   BMI 36.96 kg/m   Constitutional:  Alert and oriented, No acute distress. HEENT: George Douglas, moist mucus membranes.  Trachea midline, no masses. Cardiovascular: No clubbing, cyanosis, or edema. Respiratory: Normal respiratory effort, no increased work of breathing. GI: Abdomen is soft, nontender, nondistended, no abdominal masses, large umbilical hernia which is tender to palpation GU: No CVA tenderness.  The patient has an uncircumcised penis and a somewhat recessed scrotum with left hemiscrotal enlargement. This transilluminates. He does have some cord tenderness on the left as well as some testicular tenderness to palpation. Skin: No rashes, bruises or suspicious lesions. Lymph: No cervical or inguinal adenopathy. Neurologic: Grossly intact, no focal deficits, moving all 4 extremities. Psychiatric: Normal mood and affect.  Laboratory Data: Lab Results  Component Value Date   WBC 5.4 02/03/2017   HGB 14.9 02/03/2017   HCT 43.4 02/03/2017   MCV 92.9 02/03/2017   PLT 164 02/03/2017    Lab Results  Component Value Date   CREATININE 0.77 02/03/2017    No results found for: PSA  No results found for: TESTOSTERONE  No results found for: HGBA1C  Urinalysis    Component Value Date/Time   COLORURINE STRAW (A) 02/03/2017 2357   APPEARANCEUR CLEAR (A) 02/03/2017 2357   LABSPEC 1.004 (L) 02/03/2017 2357   George Douglas 7.0 02/03/2017 2357   GLUCOSEU NEGATIVE 02/03/2017 2357   George Douglas NEGATIVE 02/03/2017 2357   George Douglas NEGATIVE 02/03/2017 2357   George Douglas 02/03/2017 2357   PROTEINUR NEGATIVE 02/03/2017 2357   NITRITE NEGATIVE 02/03/2017 2357   LEUKOCYTESUR NEGATIVE 02/03/2017 2357    Pertinent Imaging: I reviewed the ultrasound, CT scan, and MRI images and reviewed them with the patient.  Assessment & Plan:  The patient has a symptomatic left hydrocele. We discussed the treatment options and the patient would like to have a left hydrocelectomy. He would like to  get this done concurrently with his umbilical hernia repair which is likely to be scheduled in the near future with Dr. Dahlia Douglas. This would be a reasonable combination case, and I think we could work this out with the schedules to make it easier for the patient. He will likely need to do this case with George Douglas or George Douglas which I have explained to the patient. I explained the operation to him in significant detail. We discussed the surgical approach of a scrotal midline incision. We also discussed the recovery which is usually for 5 days of soreness and for 5 weeks of inflammation and swelling. He understands the risks of recurrence, infection, and hematoma.  Hopefully, we can get this scheduled in the near future.  For the patient's testicular discomfort I recommended that he use high doses of ibuprofen to help reduce his inflammation. This should be adequate for better pain control.  1. Testicle pain  - Urinalysis, Complete   No Follow-up on file.  Ardis Hughs, [facility] [address], [place], [place] [address] [phone]

## 2017-03-04 ENCOUNTER — Other Ambulatory Visit: Payer: Self-pay | Admitting: Radiology

## 2017-03-05 ENCOUNTER — Other Ambulatory Visit: Payer: Self-pay | Admitting: Radiology

## 2017-03-06 ENCOUNTER — Other Ambulatory Visit: Payer: Self-pay | Admitting: Radiology

## 2017-03-06 ENCOUNTER — Encounter: Payer: Self-pay | Admitting: Surgery

## 2017-03-06 ENCOUNTER — Telehealth: Payer: Self-pay | Admitting: Surgery

## 2017-03-06 ENCOUNTER — Ambulatory Visit (INDEPENDENT_AMBULATORY_CARE_PROVIDER_SITE_OTHER): Payer: BLUE CROSS/BLUE SHIELD | Admitting: Surgery

## 2017-03-06 VITALS — BP 146/77 | HR 77 | Temp 98.3°F | Ht 64.0 in | Wt 215.0 lb

## 2017-03-06 DIAGNOSIS — K8020 Calculus of gallbladder without cholecystitis without obstruction: Secondary | ICD-10-CM

## 2017-03-06 NOTE — Patient Instructions (Signed)
Usted a pedido Conservator, museum/gallery su Hernia Umbilical. Esto esta programado para hacer el 64 de [place] del 2018 con el Doctor [place] Pabon y la Journalist, newspaper en [place].  Por favor lea el papel azul si tiene [place] pregunta sobre su [place].  Tiene que saber que usted va a estar fuera del trabajo por 1-2 semanas. Tambien sepa que va a tener restriccion de no levantar mas de 15 lbs. por 6 semanas despues de su cirugia. Estimamente hasta el 28 de Noviembre del 2018.     Hernia umbilical en los adultos (Umbilical Hernia, Adult) Una hernia es una protrusin de tejido que sobresale a travs de una abertura [employer]. Una hernia umbilical se produce en el abdomen, cerca del ombligo. La hernia puede contener tejidos del intestino delgado, el intestino grueso o tejido graso que recubre el intestino (epipln). Las hernias [employer] en los adultos suelen empeorar con el tiempo y requieren tratamiento quirrgico. Hay varios tipos de hernias umbilicales. Puede tener:  Una hernia ubicada justo por debajo o por arriba del ombligo (hernia inguinal indirecta). Es el tipo de hernia umbilical ms frecuente en los adultos.  Una hernia que se forma a travs de una abertura hecha por el ombligo (hernia inguinal directa).  Una hernia que aparece y desaparece (hernia reducible). Una hernia reducible puede ser visible solo al hacer fuerza, levantar objetos pesados o toser. Este tipo de hernia se puede reintroducir en el abdomen (reducir).  Una hernia que aprisiona tejido abdominal (hernia encarcelada). Este tipo de hernia es irreducible.  Una hernia que interrumpe el flujo de sangre a los tejidos en su interior (hernia estrangulada). Si esto ocurre, los tejidos Radio broadcast assistant a Pharmacologist. Este tipo de hernia requiere tratamiento de Freight forwarder. CAUSAS Una hernia umbilical se produce cuando el tejido dentro del abdomen ejerce presin sobre una zona debilitada de los msculos abdominales. FACTORES DE  RIESGO Puede correr un mayor riesgo de tener esta afeccin en los siguientes casos:  Tiene obesidad.  Tuvo varios embarazos.  Tiene una acumulacin de lquido dentro del abdomen (ascitis).  Se someti a una ciruga que [employer] abdominales. SNTOMAS El principal sntoma de esta afeccin es un bulto en el ombligo o cerca de este que no causa dolor. Una hernia reducible puede ser visible solo al hacer fuerza, levantar objetos pesados o toser. Otros sntomas pueden incluir los siguientes:  Dolor sordo.  Sensacin de opresin. Los sntomas de una hernia estrangulada pueden incluir los siguientes:  Dolor que se vuelve cada vez ms intenso.  Nuseas y vmitos.  Dolor al ejercer presin sobre la hernia.  Cambio de color de la piel que recubre la hernia que se torna roja o violcea.  Estreimiento.  Sangre en la materia fecal (heces). DIAGNSTICO Esta afeccin se puede diagnosticar en funcin de lo siguiente:  Un examen fsico. Pueden pedirle que tosa o que haga fuerza mientras est de pie. Estas acciones aumentan la presin dentro del abdomen y empujan la hernia a travs de la abertura en los msculos. El mdico puede ejercer presin sobre la hernia para tratar de reducirla.  Los sntomas y antecedentes mdicos. TRATAMIENTO La ciruga es el nico tratamiento para una hernia umbilical. En el caso de que la hernia est estrangulada, esta se realiza lo antes posible. Si tiene una hernia pequea que no est encarcelada, tal vez tenga que bajar de peso antes de la [place]. INSTRUCCIONES PARA EL CUIDADO EN EL HOGAR  Baje de peso, si se lo indic el  mdico.  No trate de reintroducir la hernia.  Observe si la hernia cambia de color o de tamao. Infrmele al mdico si se producen cambios.  Tal vez deba evitar las actividades que aumentan la presin sobre la hernia.  No levante objetos que pesen ms de 10libras (4,5kg) hasta que el mdico le diga que es seguro.  Tome  los medicamentos de venta libre y los recetados solamente como se lo haya indicado el mdico.  Consulting civil engineer a todas las visitas de control como se lo haya indicado el mdico. Esto es importante. SOLICITE ATENCIN MDICA SI:  La hernia se agranda.  La hernia le causa dolor. SOLICITE ATENCIN MDICA DE INMEDIATO SI:  Tiene un dolor intenso y repentino cerca de la zona de la hernia.  Tiene dolor, as como nuseas o vmitos.  Tiene dolor y la piel que recubre la hernia cambia de color.  Tiene fiebre. Esta informacin no tiene Marine scientist el consejo del mdico. Asegrese de hacerle al mdico cualquier pregunta que tenga. Document Released: 11/03/2015 Document Revised: 11/03/2015 Document Reviewed: 11/03/2015 Elsevier Interactive Patient Education  2018 [place], cuidados posteriores (Hydrocelectomy, Care After) [place] prximas semanas. Estas indicaciones le proporcionan informacin acerca de cmo deber cuidarse despus del procedimiento. El mdico tambin podr darle instrucciones ms especficas. El tratamiento ha sido planificado segn las prcticas mdicas actuales, pero en algunos casos pueden ocurrir problemas. Comunquese con el mdico si tiene algn problema o dudas despus del procedimiento. QU ESPERAR DESPUS DEL PROCEDIMIENTO Despus del procedimiento, es normal que la bolsa que contiene los testculos (escroto) est adolorida, hinchada y con hematomas. INSTRUCCIONES PARA EL CUIDADO EN EL HOGAR El bao  Pregntele al mdico cundo puede comenzar a baarse o Social worker.  Si le indicaron que use un soporte deportivo, squeselo cuando se bae. Cuidado de la incisin  Siga las indicaciones del mdico acerca del cuidado de la incisin. Haga lo siguiente: ? Lvese las manos con agua y jabn antes de Quarry manager las vendas (vendaje). Use desinfectante para manos si no dispone de [place]. ? Cambie el vendaje como se lo haya  indicado el mdico. ? No retire los puntos (suturas).  Controle la incisin y el escroto todos los das para detectar signos de infeccin. Est atento a los siguientes signos: ? Aumento del enrojecimiento, la hinchazn o Conservation officer, historic buildings. ? Hemorragias o secreciones. ? Calor. ? Pus o mal olor. Control del dolor, la rigidez y la hinchazn  Si se lo indican, aplique hielo sobre la zona lesionada: ? Field seismologist hielo en una bolsa plstica. ? Coloque una [employer] piel y la bolsa de hielo. ? Coloque el hielo durante 8minutos, 2 a 3veces por da. Conducir  No conduzca durante 24horas si le administraron un sedante.  No conduzca ni opere maquinaria pesada mientras toma analgsicos recetados.  Pregntele al mdico cundo es seguro volver a Forensic psychologist. Actividad  No realice ninguna actividad que demande un gran esfuerzo y Teacher, early years/pre (que sean intensas) por el tiempo que le haya indicado el mdico.  Reanude sus actividades normales como se lo haya indicado el mdico. Pregntele al mdico qu actividades son seguras para usted.  No levante objetos que pesen ms de 10libras (4,5kg) hasta que el mdico le diga que es seguro. Instrucciones generales  [employer] de venta libre y los recetados solamente como se lo haya indicado el mdico.  Consulting civil engineer a todas las visitas de control como se lo haya indicado el mdico.  Esto es importante.  Si le indicaron que use un soporte deportivo, selo como se lo haya indicado el mdico.  Si le colocaron un drenaje durante el procedimiento, deber volver para que se lo retiren. SOLICITE ATENCIN MDICA SI:  El dolor empeora.  Tiene ms enrojecimiento, hinchazn o dolor alrededor del escroto.  Observa lquido o sangre que salen del escroto.  La incisin est caliente al tacto.  Tiene pus o percibe mal olor que proviene del escroto.  Tiene fiebre. Esta informacin no tiene Marine scientist el consejo del mdico. Asegrese de hacerle al  mdico cualquier pregunta que tenga. Document Released: 05/15/2015 Elsevier Interactive Patient Education  2017 [place] laparoscpica, cuidados posteriores (Laparoscopic Cholecystectomy, Care After) Estas indicaciones le proporcionan informacin acerca de cmo deber cuidarse despus del procedimiento. El mdico tambin podr darle instrucciones especficas. Comunquese con el mdico si tiene algn problema o tiene preguntas despus del procedimiento. CUIDADOS EN EL HOGAR Cuidado de la incisin  Siga las indicaciones del mdico en lo que respecta al cuidado de los cortes de la ciruga (incisiones). Haga lo siguiente: ? Lvese las manos con agua y jabn antes de Quarry manager las vendas (vendaje). Use un desinfectante para manos si no dispone de [place]. ? Cambie el vendaje como se lo haya indicado el mdico. ? No retire los puntos (suturas), el [employer] para la piel o las tiras [place]. Tal vez deban dejarse puestos en la piel durante 2semanas o ms tiempo. Si las tiras [place] se despegan y se enroscan, puede recortar los bordes sueltos. No retire las tiras [facility] por completo a menos que el mdico lo autorice.  No tome baos de inmersin, no practique natacin ni use el jacuzzi hasta que el mdico lo autorice. Pregntele al mdico si puede ducharse. Thurston Pounds solo le permitan darse baos de [place]. Instrucciones generales  [employer] de venta libre y los recetados solamente como se lo haya indicado el mdico.  No conduzca ni use maquinaria pesada mientras toma analgsicos recetados.  Reanude la dieta normal como se lo haya indicado el mdico.  No levante ningn objeto que pese ms de 10libras (4,5kg).  No practique deportes de contacto durante 1semana o hasta que el mdico lo autorice. SOLICITE AYUDA SI:  Tiene enrojecimiento, hinchazn o Management consultant de los cortes quirrgicos.  Tiene secrecin de lquido, sangre o pus que [employer]  cortes.  Percibe que sale mal olor de la zona de las incisiones.  Los cortes quirrgicos se abren.  Tiene fiebre.  SOLICITE AYUDA DE INMEDIATO SI:  Tiene una erupcin cutnea.  Tiene dificultad para respirar.  Siente dolor en el pecho.  Siente que Conservation officer, historic buildings en los hombros (en la zona donde van los breteles) Boykin Reaper.  Se desvanece (se desmaya) o se marea mientras est de pie.  Tiene dolor muy intenso de vientre (abdomen).  Tiene malestar estomacal (nuseas) o vomita durante ms de 1da.  Esta informacin no tiene Marine scientist el consejo del mdico. Asegrese de hacerle al mdico cualquier pregunta que tenga. Document Released: 02/13/2011 Document Revised: 02/22/2015 Document Reviewed: 11/20/2015 Elsevier Interactive Patient Education  2017 [employer].

## 2017-03-06 NOTE — Telephone Encounter (Signed)
Pt advised of pre op date/time and sx date. Sx: 04/01/17--Dr Pabon-Laparoscopic cholecystectomy, open left inguinal hernia repair, Dr Cristobal Goldmann of left hydrocele.  Pre op: 03/21/17 @ 9:00am--office. Interpreter requested.   Patient made aware to call [phone], between 1-3:00pm the day before surgery, to find out what time to arrive.

## 2017-03-06 NOTE — Progress Notes (Signed)
Outpatient Surgical Follow Up  03/06/2017  George Douglas is an 61 y.o. male.   Chief Complaint  Patient presents with  . Follow-up    Umbilical Pain/Go over MRCP, ECHO and labs    HPI: George Douglas following up after a symptomatic umbilical hernia and symptomatic cholelithiasis. I have obtained a consultation with urology regarding his hydrocele and a recommend surgical intervention. I have also personally reviewed his CT scan once again showing evidence of cholelithiasis and umbilical hernia. I have also reviewed his echocardiogram that shows preserved ejection fraction   Past Medical History:  Diagnosis Date  . Cardiomegaly   . Collagen vascular disease ([place])   . Hypertension     Past Surgical History:  Procedure Laterality Date  . NO PAST SURGERIES      Family History  Problem Relation Age of Onset  . Arthritis Mother   . Prostate cancer Neg Hx   . Bladder Cancer Neg Hx   . Kidney cancer Neg Hx     Social History:  reports that he has never smoked. He has never used smokeless tobacco. He reports that he drinks alcohol. He reports that he does not use drugs.  Allergies: No Known Allergies  Medications reviewed.    ROS Full ROS performed and is otherwise negative other than what is stated in HPI   BP (!) 146/77   Pulse 77   Temp 98.3 F (36.8 C) (Oral)   Ht 5\' 4"  (1.626 m)   Wt 97.5 kg (215 lb)   BMI 36.90 kg/m   Physical Exam  Constitutional: He is oriented to person, place, and time and well-developed, well-nourished, and in no distress. No distress.  Neck: Normal range of motion. No JVD present.  Cardiovascular: Normal rate, regular rhythm and normal heart sounds.   Pulmonary/Chest: Effort normal and breath sounds normal. No stridor. No respiratory distress. He has no wheezes. He has no rales. He exhibits no tenderness.  Abdominal: Soft. He exhibits no distension and no mass. There is no rebound and no guarding.  Tender but reducible UH, no peritonitis   Musculoskeletal: Normal range of motion.  Neurological: He is alert and oriented to person, place, and time. Gait normal. GCS score is 15.  Skin: Skin is warm and dry.  Psychiatric: Mood, memory, affect and judgment normal.  Nursing note and vitals reviewed.   Assessment/Plan: Symptomatic cholelithiasis in addition to symptomatic umbilical hernia. Discussed with the patient in detail about my recommendation for U. hernia repair and laparoscopic vasectomy at the same operative setting. And the question will become whether or not mesh will be needed. I discussed with him that this will be an intraoperative decision and if I think that the defect is greater than 2 cm with a we'll put prostatic mesh in him. We'll also coordinate with Dr. Erlene Quan and so she can perform the hydrocele surgery at the same time. Extensive counseling provided. Greater than 50% of the 25 minutes  visit was spent in counseling/coordination of care   Caroleen Hamman, MD [place] Surgeon

## 2017-03-10 ENCOUNTER — Telehealth: Payer: Self-pay

## 2017-03-10 NOTE — Telephone Encounter (Signed)
Dr. Ihor Austin [facility]) called asking to speak with Dr. Dahlia Byes in reference to a mutual patient. I told him that Dr. Dahlia Byes was working the night shift today so he would probably call him at night time. Dr. Ihor Austin was okay with that.  I will let Dr. Dahlia Byes know to call Dr. Ihor Austin at [phone].

## 2017-03-21 ENCOUNTER — Encounter
Admission: RE | Admit: 2017-03-21 | Discharge: 2017-03-21 | Disposition: A | Discharge status: Home / Self Care | Payer: BLUE CROSS/BLUE SHIELD | Source: Ambulatory Visit | Attending: Surgery | Admitting: Surgery

## 2017-03-21 ENCOUNTER — Other Ambulatory Visit: Payer: Self-pay

## 2017-03-21 DIAGNOSIS — K4090 Unilateral inguinal hernia, without obstruction or gangrene, not specified as recurrent: Secondary | ICD-10-CM | POA: Insufficient documentation

## 2017-03-21 DIAGNOSIS — K429 Umbilical hernia without obstruction or gangrene: Secondary | ICD-10-CM

## 2017-03-21 DIAGNOSIS — Z01818 Encounter for other preprocedural examination: Secondary | ICD-10-CM | POA: Insufficient documentation

## 2017-03-21 HISTORY — DX: Localized edema: R60.0

## 2017-03-21 HISTORY — DX: Reserved for concepts with insufficient information to code with codable children: IMO0002

## 2017-03-21 HISTORY — DX: Localized swelling, mass and lump, unspecified: R22.9

## 2017-03-21 MED ORDER — CHLORHEXIDINE GLUCONATE CLOTH 2 % EX PADS
6.0000 | MEDICATED_PAD | Freq: Once | CUTANEOUS | Status: DC
  Filled 2017-03-21: qty 6

## 2017-03-21 NOTE — Patient Instructions (Signed)
Your procedure is scheduled on: 04/01/17 Tues Su procedimiento est programado para: Report to 2nd floor medical mall To find out your arrival time please call [phone] between 1PM - 3PM on 03/31/17 Mon Para saber su hora de llegada por favor llame al ([place]:  Remember: Instructions that are not followed completely may result in serious medical risk, up to and including death, or upon the discretion of your surgeon and anesthesiologist your surgery may need to be rescheduled.  Recuerde: Las instrucciones que no se siguen completamente Heritage manager en un riesgo de salud grave, incluyendo hasta la [place] o a discrecin de su cirujano y Environmental health practitioner, su ciruga se puede posponer.   __X_ 1.Do not eat food after midnight the night before your procedure. No   gum chewing or hard candies. You may drink clear liquids up to 2 hours   before you are scheduled to arrive for your surgery- DO not drink clear   liquids within 2 hours of the start of your surgery.     Clear Liquids include:    water, apple juice without pulp, clear carbohydrate drink such as    Clearfast of [place]art[place]ade, Black Coffee or Tea (Do not add anything to   coffee or tea).      No coma nada despus de la medianoche de la noche anterior a su   procedimiento. No coma chicles ni caramelos duros. Puede tomar   lquidos claros hasta 2 horas antes de su hora programada de llegada al   hospital para su procedimiento. No tome lquidos claros durante el   transcurso de las 2 horas de su llegada programada al hospital para su   procedimiento, ya que esto puede llevar a que su procedimiento se   retrase o tenga que volver a Health and safety inspector.  Los lquidos claros incluyen:         - Agua o jugo de Betances sin pulpa         - Bebidas claras con carbohidratos como ClearFast o Gatorade         - Caf negro o t claro (sin leche, sin cremas, no agregue nada al caf ni al  t)  No tome nada que no est en esta  lista.  Los pacientes con diabetes tipo 1 y tipo 2 solo deben Agricultural engineer.  Llame a la clnica de PreCare o a la unidad de Same Day Surgery si  tiene alguna pregunta sobre estas instrucciones.              _X__ 2.Do Not Smoke or use e-cigarettes For 24 Hours Prior to Your    Surgery.  Do not use any chewable tobacco products for at least 6   hours prior to surgery.    No fume ni use cigarrillos electrnicos durante las 24 horas previas   a su [place].  No use ningn producto de tabaco masticable durante  al menos 6 horas antes de la [place].     __X_ 3. No alcohol for 24 hours before or after surgery.    No tome alcohol durante las 24 horas antes ni despus de la [place].   ____4. Bring all medications with you on the day of surgery if instructed.    Lleve todos los medicamentos con usted el da de su ciruga si se le   ha indicado as.   ____ 5. Notify your doctor if there is any change in your medical condition (cold,   fever, infections).  Informe a su mdico si hay algn cambio en su condicin mdica   (resfriado, fiebre, infecciones).   Do not wear jewelry, make-up, hairpins, clips or nail polish.  No use joyas, maquillajes, pinzas/ganchos para el cabello ni esmalte de uas.  Do not wear lotions, powders, or perfumes. You may wear deodorant.  No use lociones, polvos o perfumes.  Puede usar desodorante.    Do not shave 48 hours prior to surgery. Men may shave face and neck.  No se afeite 48 horas antes de la [place].  Los hombres pueden [employer] cara  y el cuello.   Do not bring valuables to the hospital.   No lleve objetos [place] is not responsible for any belongings or valuables.  [place] no se hace responsable de ningn tipo de pertenencias u objetos de Geographical information systems officer.               Contacts, dentures or bridgework may not be worn into surgery.  Los lentes de [place], las dentaduras postizas o puentes no se pueden usar en la [place].  Leave your  suitcase in the car. After surgery it may be brought to your room.  Deje su maleta en el auto.  Despus de la ciruga podr traerla a su habitacin.  For patients admitted to the hospital, discharge time is determined by your treatment team.  Para los pacientes que sean ingresados al hospital, el tiempo en el cual se le dar de alta es determinado por su equipo de [place].   Patients discharged the day of surgery will not be allowed to drive home. A los pacientes que se les da de alta el mismo da de la ciruga no se les permitir conducir a [place] representative.   Please read over the following fact sheets that you were given: Por favor Pier[place] informacin que le dieron:    ____ Take these medicines the morning of surgery with A SIP OF WATER:          [employer] medicinas la maana de la ciruga con UN SORBO DE AGUA:  1. None  2.   3.   4.       5.  6.  ____ Fleet Enema (as directed)          Enema de Fleet (segn lo indicado)    _x___ Use CHG Soap as directed          Utilice el jabn de CHG segn lo indicado  ____ Use inhalers on the day of surgery          Use los inhaladores el da de la ciruga  ____ Stop metformin 2 days prior to surgery          Deje de tomar el metformin 2 das antes de la ciruga    ____ Take 1/2 of usual insulin dose the night before surgery and none on the morning of surgery           Tome la mitad de la dosis habitual de insulina la noche antes de la [place] y no tome nada en la maana de la             ciruga  ____ Stop Coumadin/Plavix/aspirin on           Deje de tomar el Coumadin/Plavix/aspirina el da:  __x__ Stop Anti-inflammatories on       Deje de tomar antiinflamatorios el da:   ____ Stop supplements until after surgery  Deje de tomar suplementos hasta despus de la ciruga  ____ Bring C-Pap to the hospital          [place] al hospital

## 2017-03-31 DIAGNOSIS — D447 Neoplasm of uncertain behavior of aortic body and other paraganglia: Secondary | ICD-10-CM | POA: Insufficient documentation

## 2017-03-31 MED ORDER — CEFAZOLIN SODIUM-DEXTROSE 2-4 GM/100ML-% IV SOLN
2.0000 g | INTRAVENOUS | Status: AC
  Administered 2017-04-01: 2 g via INTRAVENOUS

## 2017-04-01 ENCOUNTER — Ambulatory Visit
Admission: RE | Admit: 2017-04-01 | Discharge: 2017-04-01 | Disposition: A | Discharge status: Home / Self Care | Payer: BLUE CROSS/BLUE SHIELD | Source: Ambulatory Visit | Attending: Surgery | Admitting: Surgery

## 2017-04-01 ENCOUNTER — Ambulatory Visit: Payer: BLUE CROSS/BLUE SHIELD | Admitting: Certified Registered Nurse Anesthetist

## 2017-04-01 ENCOUNTER — Encounter: Payer: Self-pay | Admitting: *Deleted

## 2017-04-01 ENCOUNTER — Encounter
Admission: RE | Disposition: A | Discharge status: Home / Self Care | Payer: Self-pay | Source: Ambulatory Visit | Attending: Surgery

## 2017-04-01 DIAGNOSIS — M359 Systemic involvement of connective tissue, unspecified: Secondary | ICD-10-CM | POA: Insufficient documentation

## 2017-04-01 DIAGNOSIS — Z6836 Body mass index (BMI) 36.0-36.9, adult: Secondary | ICD-10-CM | POA: Insufficient documentation

## 2017-04-01 DIAGNOSIS — K8010 Calculus of gallbladder with chronic cholecystitis without obstruction: Secondary | ICD-10-CM | POA: Diagnosis not present

## 2017-04-01 DIAGNOSIS — N433 Hydrocele, unspecified: Secondary | ICD-10-CM | POA: Insufficient documentation

## 2017-04-01 DIAGNOSIS — I119 Hypertensive heart disease without heart failure: Secondary | ICD-10-CM | POA: Diagnosis not present

## 2017-04-01 DIAGNOSIS — K429 Umbilical hernia without obstruction or gangrene: Secondary | ICD-10-CM | POA: Diagnosis not present

## 2017-04-01 DIAGNOSIS — N432 Other hydrocele: Secondary | ICD-10-CM | POA: Diagnosis not present

## 2017-04-01 DIAGNOSIS — N200 Calculus of kidney: Secondary | ICD-10-CM | POA: Insufficient documentation

## 2017-04-01 DIAGNOSIS — K811 Chronic cholecystitis: Secondary | ICD-10-CM | POA: Diagnosis present

## 2017-04-01 HISTORY — PX: CHOLECYSTECTOMY: SHX55

## 2017-04-01 HISTORY — PX: HYDROCELE EXCISION: SHX482

## 2017-04-01 HISTORY — PX: UMBILICAL HERNIA REPAIR: SHX196

## 2017-04-01 SURGERY — REPAIR, HERNIA, UMBILICAL, ADULT
Anesthesia: General

## 2017-04-01 MED ORDER — SUCCINYLCHOLINE CHLORIDE 20 MG/ML IJ SOLN
INTRAMUSCULAR | Status: DC | PRN
  Administered 2017-04-01: 120 mg via INTRAVENOUS

## 2017-04-01 MED ORDER — ACETAMINOPHEN 10 MG/ML IV SOLN
INTRAVENOUS | Status: DC | PRN
  Administered 2017-04-01: 1000 mg via INTRAVENOUS

## 2017-04-01 MED ORDER — PROPOFOL 10 MG/ML IV BOLUS
INTRAVENOUS | Status: AC
  Filled 2017-04-01: qty 20

## 2017-04-01 MED ORDER — PHENYLEPHRINE HCL 10 MG/ML IJ SOLN
INTRAMUSCULAR | Status: DC | PRN
  Administered 2017-04-01 (×3): 100 ug via INTRAVENOUS

## 2017-04-01 MED ORDER — LIDOCAINE HCL (CARDIAC) 20 MG/ML IV SOLN
INTRAVENOUS | Status: DC | PRN
  Administered 2017-04-01: 40 mg via INTRAVENOUS

## 2017-04-01 MED ORDER — ONDANSETRON HCL 4 MG/2ML IJ SOLN: 4.0000 mg | Freq: Once | INTRAMUSCULAR | Status: DC | PRN

## 2017-04-01 MED ORDER — FENTANYL CITRATE (PF) 100 MCG/2ML IJ SOLN
INTRAMUSCULAR | Status: AC
  Administered 2017-04-01: 25 ug via INTRAVENOUS
  Filled 2017-04-01: qty 2

## 2017-04-01 MED ORDER — PROPOFOL 10 MG/ML IV BOLUS
INTRAVENOUS | Status: DC | PRN
  Administered 2017-04-01: 150 mg via INTRAVENOUS

## 2017-04-01 MED ORDER — ACETAMINOPHEN 10 MG/ML IV SOLN
INTRAVENOUS | Status: AC
  Filled 2017-04-01: qty 100

## 2017-04-01 MED ORDER — BUPIVACAINE HCL (PF) 0.5 % IJ SOLN
INTRAMUSCULAR | Status: AC
  Filled 2017-04-01: qty 30

## 2017-04-01 MED ORDER — LIDOCAINE HCL (PF) 1 % IJ SOLN
INTRAMUSCULAR | Status: AC
  Filled 2017-04-01: qty 30

## 2017-04-01 MED ORDER — ONDANSETRON HCL 4 MG/2ML IJ SOLN
INTRAMUSCULAR | Status: AC
  Filled 2017-04-01: qty 2

## 2017-04-01 MED ORDER — FENTANYL CITRATE (PF) 100 MCG/2ML IJ SOLN
INTRAMUSCULAR | Status: AC
  Filled 2017-04-01: qty 2

## 2017-04-01 MED ORDER — CEFAZOLIN SODIUM-DEXTROSE 2-4 GM/100ML-% IV SOLN
INTRAVENOUS | Status: AC
  Filled 2017-04-01: qty 100

## 2017-04-01 MED ORDER — MIDAZOLAM HCL 2 MG/2ML IJ SOLN
INTRAMUSCULAR | Status: AC
  Filled 2017-04-01: qty 2

## 2017-04-01 MED ORDER — SUCCINYLCHOLINE CHLORIDE 20 MG/ML IJ SOLN
INTRAMUSCULAR | Status: AC
  Filled 2017-04-01: qty 1

## 2017-04-01 MED ORDER — ROCURONIUM BROMIDE 50 MG/5ML IV SOLN
INTRAVENOUS | Status: AC
  Filled 2017-04-01: qty 1

## 2017-04-01 MED ORDER — HYDROMORPHONE HCL 1 MG/ML IJ SOLN
INTRAMUSCULAR | Status: AC
  Administered 2017-04-01: 0.5 mg via INTRAVENOUS
  Filled 2017-04-01: qty 1

## 2017-04-01 MED ORDER — BUPIVACAINE-EPINEPHRINE (PF) 0.25% -1:200000 IJ SOLN
INTRAMUSCULAR | Status: AC
  Filled 2017-04-01: qty 30

## 2017-04-01 MED ORDER — SUGAMMADEX SODIUM 500 MG/5ML IV SOLN
INTRAVENOUS | Status: AC
  Filled 2017-04-01: qty 5

## 2017-04-01 MED ORDER — FAMOTIDINE 20 MG PO TABS
20.0000 mg | ORAL_TABLET | Freq: Once | ORAL | Status: AC
  Administered 2017-04-01: 20 mg via ORAL

## 2017-04-01 MED ORDER — FAMOTIDINE 20 MG PO TABS
ORAL_TABLET | ORAL | Status: AC
  Administered 2017-04-01: 20 mg via ORAL
  Filled 2017-04-01: qty 1

## 2017-04-01 MED ORDER — SUGAMMADEX SODIUM 500 MG/5ML IV SOLN
INTRAVENOUS | Status: DC | PRN
  Administered 2017-04-01: 200 mg via INTRAVENOUS

## 2017-04-01 MED ORDER — LIDOCAINE HCL 1 % IJ SOLN
INTRAMUSCULAR | Status: DC | PRN
  Administered 2017-04-01: 4 mL

## 2017-04-01 MED ORDER — BUPIVACAINE HCL 0.5 % IJ SOLN
INTRAMUSCULAR | Status: DC | PRN
  Administered 2017-04-01: 4 mL

## 2017-04-01 MED ORDER — HYDROCODONE-ACETAMINOPHEN 5-325 MG PO TABS: 1.0000 | ORAL_TABLET | Freq: Four times a day (QID) | ORAL | 0 refills | Status: AC | PRN

## 2017-04-01 MED ORDER — HYDROMORPHONE HCL 1 MG/ML IJ SOLN
0.2500 mg | INTRAMUSCULAR | Status: DC | PRN
  Administered 2017-04-01 (×2): 0.5 mg via INTRAVENOUS

## 2017-04-01 MED ORDER — HYDROCODONE-ACETAMINOPHEN 5-325 MG PO TABS
1.0000 | ORAL_TABLET | Freq: Four times a day (QID) | ORAL | Status: DC | PRN
Start: 2017-04-01 — End: 2017-04-01

## 2017-04-01 MED ORDER — FENTANYL CITRATE (PF) 100 MCG/2ML IJ SOLN
INTRAMUSCULAR | Status: DC | PRN
Start: 2017-04-01 — End: 2017-04-01
  Administered 2017-04-01 (×4): 50 ug via INTRAVENOUS
  Administered 2017-04-01: 100 ug via INTRAVENOUS

## 2017-04-01 MED ORDER — ONDANSETRON HCL 4 MG/2ML IJ SOLN
INTRAMUSCULAR | Status: DC | PRN
  Administered 2017-04-01: 4 mg via INTRAVENOUS

## 2017-04-01 MED ORDER — ROCURONIUM BROMIDE 100 MG/10ML IV SOLN
INTRAVENOUS | Status: DC | PRN
  Administered 2017-04-01: 5 mg via INTRAVENOUS
  Administered 2017-04-01: 10 mg via INTRAVENOUS
  Administered 2017-04-01: 5 mg via INTRAVENOUS
  Administered 2017-04-01: 10 mg via INTRAVENOUS
  Administered 2017-04-01: 35 mg via INTRAVENOUS

## 2017-04-01 MED ORDER — LACTATED RINGERS IV SOLN
INTRAVENOUS | Status: DC
  Administered 2017-04-01 (×2): via INTRAVENOUS

## 2017-04-01 MED ORDER — MIDAZOLAM HCL 2 MG/2ML IJ SOLN
INTRAMUSCULAR | Status: DC | PRN
  Administered 2017-04-01: 2 mg via INTRAVENOUS

## 2017-04-01 MED ORDER — FENTANYL CITRATE (PF) 100 MCG/2ML IJ SOLN
25.0000 ug | INTRAMUSCULAR | Status: AC | PRN
  Administered 2017-04-01 (×6): 25 ug via INTRAVENOUS

## 2017-04-01 MED ORDER — BUPIVACAINE-EPINEPHRINE 0.25% -1:200000 IJ SOLN
INTRAMUSCULAR | Status: DC | PRN
  Administered 2017-04-01: 30 mL

## 2017-04-01 SURGICAL SUPPLY — 83 items
ADH SKN CLS APL DERMABOND .7 (GAUZE/BANDAGES/DRESSINGS) ×4
APPLICATOR COTTON TIP 6IN STRL (MISCELLANEOUS) ×4 IMPLANT
APPLIER CLIP 11 MED OPEN (CLIP)
APPLIER CLIP 5 13 M/L LIGAMAX5 (MISCELLANEOUS) ×4
APR CLP MED 11 20 MLT OPN (CLIP)
APR CLP MED LRG 5 ANG JAW (MISCELLANEOUS) ×2
BAG SPEC RTRVL LRG 6X4 10 (ENDOMECHANICALS) ×2
BLADE CLIPPER SURG (BLADE) ×8 IMPLANT
BLADE SURG 15 STRL LF DISP TIS (BLADE) ×4 IMPLANT
BLADE SURG 15 STRL SS (BLADE) ×8
CANISTER SUCT 1200ML W/VALVE (MISCELLANEOUS) ×8 IMPLANT
CHLORAPREP W/TINT 26ML (MISCELLANEOUS) ×8 IMPLANT
CHOLANGIOGRAM CATH TAUT (CATHETERS) IMPLANT
CLEANER CAUTERY TIP 5X5 PAD (MISCELLANEOUS) ×2 IMPLANT
CLIP APPLIE 11 MED OPEN (CLIP) ×2 IMPLANT
CLIP APPLIE 5 13 M/L LIGAMAX5 (MISCELLANEOUS) ×2 IMPLANT
DECANTER SPIKE VIAL GLASS SM (MISCELLANEOUS) ×4 IMPLANT
DERMABOND ADVANCED (GAUZE/BANDAGES/DRESSINGS) ×4
DERMABOND ADVANCED .7 DNX12 (GAUZE/BANDAGES/DRESSINGS) ×4 IMPLANT
DRAIN PENROSE 1/4X12 LTX (DRAIN) IMPLANT
DRAPE C-ARM XRAY 36X54 (DRAPES) ×2 IMPLANT
DRAPE INCISE IOBAN 66X45 STRL (DRAPES) ×4 IMPLANT
DRAPE LAPAROTOMY 77X122 PED (DRAPES) ×4 IMPLANT
DRAPE PED LAPAROTOMY (DRAPES) ×4 IMPLANT
DRSG TELFA 3X8 NADH (GAUZE/BANDAGES/DRESSINGS) ×4 IMPLANT
ELECT CAUTERY BLADE 6.4 (BLADE) ×4 IMPLANT
ELECT REM PT RETURN 9FT ADLT (ELECTROSURGICAL) ×8
ELECTRODE REM PT RTRN 9FT ADLT (ELECTROSURGICAL) ×4 IMPLANT
GAUZE FLUFF 18X24 1PLY STRL (GAUZE/BANDAGES/DRESSINGS) ×4 IMPLANT
GAUZE SPONGE 4X4 12PLY STRL (GAUZE/BANDAGES/DRESSINGS) ×2 IMPLANT
GLOVE BIO SURGEON STRL SZ 6.5 (GLOVE) ×3 IMPLANT
GLOVE BIO SURGEON STRL SZ7 (GLOVE) ×4 IMPLANT
GLOVE BIO SURGEONS STRL SZ 6.5 (GLOVE) ×1
GLOVE BIOGEL PI IND STRL 6.5 (GLOVE) ×2 IMPLANT
GLOVE BIOGEL PI INDICATOR 6.5 (GLOVE) ×2
GOWN STRL REUS W/ TWL LRG LVL3 (GOWN DISPOSABLE) ×10 IMPLANT
GOWN STRL REUS W/TWL LRG LVL3 (GOWN DISPOSABLE) ×16
IRRIGATION STRYKERFLOW (MISCELLANEOUS) ×2 IMPLANT
IRRIGATOR STRYKERFLOW (MISCELLANEOUS) ×4
IV CATH ANGIO 12GX3 LT BLUE (NEEDLE) ×4 IMPLANT
IV NS 1000ML (IV SOLUTION) ×4
IV NS 1000ML BAXH (IV SOLUTION) ×2 IMPLANT
KIT RM TURNOVER STRD PROC AR (KITS) ×4 IMPLANT
L-HOOK LAP DISP 36CM (ELECTROSURGICAL) ×4
LABEL OR SOLS (LABEL) ×4 IMPLANT
LHOOK LAP DISP 36CM (ELECTROSURGICAL) ×2 IMPLANT
NDL HYPO 25X1 1.5 SAFETY (NEEDLE) ×2 IMPLANT
NEEDLE HYPO 22GX1.5 SAFETY (NEEDLE) ×4 IMPLANT
NEEDLE HYPO 25X1 1.5 SAFETY (NEEDLE) ×4 IMPLANT
NS IRRIG 500ML POUR BTL (IV SOLUTION) ×8 IMPLANT
PACK BASIN MINOR ARMC (MISCELLANEOUS) ×8 IMPLANT
PACK LAP CHOLECYSTECTOMY (MISCELLANEOUS) ×4 IMPLANT
PAD CLEANER CAUTERY TIP 5X5 (MISCELLANEOUS) ×2
PAD DRESSING TELFA 3X8 NADH (GAUZE/BANDAGES/DRESSINGS) ×2 IMPLANT
PENCIL ELECTRO HAND CTR (MISCELLANEOUS) ×4 IMPLANT
POUCH SPECIMEN RETRIEVAL 10MM (ENDOMECHANICALS) ×4 IMPLANT
SCISSORS METZENBAUM CVD 33 (INSTRUMENTS) ×4 IMPLANT
SLEEVE ENDOPATH XCEL 5M (ENDOMECHANICALS) ×8 IMPLANT
SOL ANTI-FOG 6CC FOG-OUT (MISCELLANEOUS) ×2 IMPLANT
SOL FOG-OUT ANTI-FOG 6CC (MISCELLANEOUS) ×2
SPONGE LAP 18X18 5 PK (GAUZE/BANDAGES/DRESSINGS) ×4 IMPLANT
STOPCOCK 4 WAY LG BORE MALE ST (IV SETS) IMPLANT
SUPPORETR ATHLETIC LG (MISCELLANEOUS) ×2 IMPLANT
SUPPORTER ATHLETIC LG (MISCELLANEOUS) ×4
SUT CHROMIC 3 0 PS 2 (SUTURE) ×10 IMPLANT
SUT CHROMIC 3 0 SH 27 (SUTURE) IMPLANT
SUT ETHIBOND 0 MO6 C/R (SUTURE) IMPLANT
SUT ETHIBOND NAB MO 7 #0 18IN (SUTURE) ×4 IMPLANT
SUT ETHILON 3-0 FS-10 30 BLK (SUTURE)
SUT ETHILON NAB PS2 4-0 18IN (SUTURE) ×2 IMPLANT
SUT MNCRL AB 4-0 PS2 18 (SUTURE) ×4 IMPLANT
SUT VIC AB 3-0 SH 27 (SUTURE) ×20
SUT VIC AB 3-0 SH 27X BRD (SUTURE) ×4 IMPLANT
SUT VIC AB 4-0 SH 27 (SUTURE) ×4
SUT VIC AB 4-0 SH 27XANBCTRL (SUTURE) ×2 IMPLANT
SUT VICRYL 0 AB UR-6 (SUTURE) ×8 IMPLANT
SUTURE EHLN 3-0 FS-10 30 BLK (SUTURE) IMPLANT
SYR 20CC LL (SYRINGE) ×4 IMPLANT
SYRINGE 10CC LL (SYRINGE) ×4 IMPLANT
TROCAR XCEL BLUNT TIP 100MML (ENDOMECHANICALS) ×4 IMPLANT
TROCAR XCEL NON-BLD 5MMX100MML (ENDOMECHANICALS) ×4 IMPLANT
TUBING INSUFFLATOR HI FLOW (MISCELLANEOUS) ×4 IMPLANT
WATER STERILE IRR 1000ML POUR (IV SOLUTION) ×2 IMPLANT

## 2017-04-01 NOTE — Anesthesia Preprocedure Evaluation (Addendum)
Anesthesia Evaluation  Patient identified by MRN, date of birth, ID band Patient awake    Reviewed: Allergy & Precautions, NPO status , Patient's Chart, lab work & pertinent test results  Airway Mallampati: III  TM Distance: >3 FB     Dental  (+) Teeth Intact   Pulmonary neg pulmonary ROS,    breath sounds clear to auscultation       Cardiovascular Exercise Tolerance: Good hypertension, Pt. on medications  Rhythm:Regular Rate:Normal  Cardiomegaly, normal EF   Neuro/Psych negative neurological ROS     GI/Hepatic negative GI ROS, Neg liver ROS,   Endo/Other  Morbid obesity  Renal/GU negative Renal ROS     Musculoskeletal negative musculoskeletal ROS (+)   Abdominal (+) + obese,   Peds negative pediatric ROS (+)  Hematology   Anesthesia Other Findings   Reproductive/Obstetrics                            Anesthesia Physical Anesthesia Plan  ASA: III  Anesthesia Plan: General   Post-op Pain Management:    Induction:   PONV Risk Score and Plan:   Airway Management Planned: Oral ETT  Additional Equipment:   Intra-op Plan:   Post-operative Plan: Extubation in OR  Informed Consent: I have reviewed the patients History and Physical, chart, labs and discussed the procedure including the risks, benefits and alternatives for the proposed anesthesia with the patient or authorized representative who has indicated his/her understanding and acceptance.     Plan Discussed with: CRNA  Anesthesia Plan Comments:         Anesthesia Quick Evaluation

## 2017-04-01 NOTE — H&P (Signed)
Updated 04/01/17, no change.  L hydrocele.    CTAB RRR   Cesare Sumlin 02/04/1956 [id number]  Referring provider: Jules Husbands, [address] [place], [place] [address]     Chief Complaint  Patient presents with  . Testicle Pain  . Nephrolithiasis    HPI: 61 year old male with long-standing left hydrocele who is seen today for further evaluation and management options, referred by Dr. Caroleen Hamman, M.D.  The patient states that his hydroceles been present for approximately 4 years, or more. He has had intermittent left testicular pain associated with her. He first noted pain approximately 18 months ago. He has not really had any significant treatment for his pain. It is mostly tender to touch. His hydrocele has been stable for at least 2 years without significant growth. He was seen in [place] for this and was told that he needed to lose weight prior to any surgical intervention. The patient denies any significant urinary tract symptoms including frequency or urgency. He has not had any fevers or chills. He has no history of urinary tract infections or prostatitis.  The patient also has a large symptomatic umbilical hernia. He is scheduled to follow-up with general surgery and likely have surgery scheduled for repair.   The patient has undergone a CT scan of the abdomen and pelvis which demonstrated a large fat-containing umbilical hernia with some inflammation. It also demonstrated a left-sided hydrocele. He subsequent underwent an MRI to ensure that the patient had no evidence of common bile duct obstruction from his choledocholithiasis. The MRI was largely unremarkable except for the similar findings on CT scan.  PMH:     Past Medical History:  Diagnosis Date  . Cardiomegaly   . Collagen vascular disease ([place])   . Hypertension     Surgical History:      Past Surgical History:  Procedure Laterality Date  . NO PAST SURGERIES      Home  Medications:  Allergies as of 02/24/2017   No Known Allergies             Medication List     as of 02/24/2017 12:01 PM   You have not been prescribed any medications.                                   Discharge Care Instructions              Start     Ordered   02/24/17 0000  Urinalysis, Complete     02/24/17 0844      Allergies: No Known Allergies  Family History:      Family History  Problem Relation Age of Onset  . Arthritis Mother   . Prostate cancer Neg Hx   . Bladder Cancer Neg Hx   . Kidney cancer Neg Hx     Social History:  reports that he has never smoked. He has never used smokeless tobacco. He reports that he drinks alcohol. He reports that he does not use drugs.  ROS: UROLOGY Frequent Urination?: Yes Hard to postpone urination?: No Burning/pain with urination?: Yes Get up at night to urinate?: Yes Leakage of urine?: No Urine stream starts and stops?: Yes Trouble starting stream?: No Do you have to strain to urinate?: No Blood in urine?: No Urinary tract infection?: No Sexually transmitted disease?: No Injury to kidneys or bladder?: No Painful intercourse?: No Weak stream?: Yes Erection  problems?: Yes Penile pain?: No  Gastrointestinal Nausea?: No Vomiting?: No Indigestion/heartburn?: No Diarrhea?: No Constipation?: No  Constitutional Fever: No Night sweats?: No Weight loss?: No Fatigue?: No  Skin Skin rash/lesions?: No Itching?: No  Eyes Blurred vision?: No Double vision?: No  Ears/Nose/Throat Sore throat?: Yes Sinus problems?: No  Hematologic/Lymphatic Swollen glands?: No Easy bruising?: No  Cardiovascular Leg swelling?: No Chest pain?: No  Respiratory Cough?: Yes Shortness of breath?: No  Endocrine Excessive thirst?: Yes  Musculoskeletal Back pain?: No Joint pain?: No  Neurological Headaches?: Yes Dizziness?: Yes  Psychologic Depression?: No Anxiety?:  No  Physical Exam: BP 133/69 (BP Location: Left Arm, Patient Position: Sitting, Cuff Size: Normal)   Pulse 72   Ht 5\' 4"  (1.626 m)   Wt 97.7 kg (215 lb 4.8 oz)   BMI 36.96 kg/m   Constitutional:  Alert and oriented, No acute distress. HEENT: [place] AT, moist mucus membranes.  Trachea midline, no masses. Cardiovascular: No clubbing, cyanosis, or edema. Respiratory: Normal respiratory effort, no increased work of breathing. GI: Abdomen is soft, nontender, nondistended, no abdominal masses, large umbilical hernia which is tender to palpation GU: No CVA tenderness.  The patient has an uncircumcised penis and a somewhat recessed scrotum with left hemiscrotal enlargement. This transilluminates. He does have some cord tenderness on the left as well as some testicular tenderness to palpation. Skin: No rashes, bruises or suspicious lesions. Lymph: No cervical or inguinal adenopathy. Neurologic: Grossly intact, no focal deficits, moving all 4 extremities. Psychiatric: Normal mood and affect.  Laboratory Data: RecentLabs       Lab Results  Component Value Date   WBC 5.4 02/03/2017   HGB 14.9 02/03/2017   HCT 43.4 02/03/2017   MCV 92.9 02/03/2017   PLT 164 02/03/2017      RecentLabs       Lab Results  Component Value Date   CREATININE 0.77 02/03/2017      RecentLabs  No results found for: PSA    RecentLabs  No results found for: TESTOSTERONE    RecentLabs  No results found for: HGBA1C    Urinalysis Labs(Brief)          Component Value Date/Time   COLORURINE STRAW (A) 02/03/2017 2357   APPEARANCEUR CLEAR (A) 02/03/2017 2357   LABSPEC 1.004 (L) 02/03/2017 2357   PHURINE 7.0 02/03/2017 2357   GLUCOSEU NEGATIVE 02/03/2017 2357   [place] NEGATIVE 02/03/2017 2357   BILIRUBINUR NEGATIVE 02/03/2017 2357   [place] 02/03/2017 2357   PROTEINUR NEGATIVE 02/03/2017 2357   NITRITE NEGATIVE 02/03/2017 2357   LEUKOCYTESUR NEGATIVE  02/03/2017 2357      Pertinent Imaging: I reviewed the ultrasound, CT scan, and MRI images and reviewed them with the patient.  Assessment & Plan:  The patient has a symptomatic left hydrocele. We discussed the treatment options and the patient would like to have a left hydrocelectomy. He would like to get this done concurrently with his umbilical hernia repair which is likely to be scheduled in the near future with Dr. Dahlia Byes. This would be a reasonable combination case, and I think we could work this out with the schedules to make it easier for the patient. He will likely need to do this case with Dr. Pilar Jarvis or Erlene Quan which I have explained to the patient. I explained the operation to him in significant detail. We discussed the surgical approach of a scrotal midline incision. We also discussed the recovery which is usually for 5 days of soreness and for 5  weeks of inflammation and swelling. He understands the risks of recurrence, infection, and hematoma.  Hopefully, we can get this scheduled in the near future.  For the patient's testicular discomfort I recommended that he use high doses of ibuprofen to help reduce his inflammation. This should be adequate for better pain control.  1. Testicle pain  - Urinalysis, Complete   No Follow-up on file.  Ardis Hughs, [facility] [address] [place], [place] [address] [phone]

## 2017-04-01 NOTE — OR Nursing (Signed)
Patient interviewed using interpreter language line with Fuller Song [phone]

## 2017-04-01 NOTE — Anesthesia Post-op Follow-up Note (Signed)
Anesthesia QCDR form completed.        

## 2017-04-01 NOTE — Transfer of Care (Signed)
Immediate Anesthesia Transfer of Care Note  Patient: George Douglas  Procedure(s) Performed: HERNIA REPAIR UMBILICAL ADULT (N/A ) LAPAROSCOPIC CHOLECYSTECTOMY (N/A ) EXCISION HYDROCELE (Left )  Patient Location: PACU  Anesthesia Type:General  Level of Consciousness: sedated  Airway & Oxygen Therapy: Patient Spontanous Breathing and Patient connected to face mask oxygen  Post-op Assessment: Report given to RN and Post -op Vital signs reviewed and stable  Post vital signs: Reviewed and stable  Last Vitals:  Vitals:   04/01/17 0803 04/01/17 1345  BP: 140/82 (!) 150/95  Pulse: 73 78  Resp: 18 17  Temp: 36.8 C 36.8 C  SpO2: 95% 100%    Last Pain:  Vitals:   04/01/17 0803  TempSrc: Oral         Complications: No apparent anesthesia complications

## 2017-04-01 NOTE — Discharge Instructions (Signed)
Hidrocelectoma, cuidados posteriores (Hydrocelectomy, Care After) Ferron prximas semanas. Estas indicaciones le proporcionan informacin acerca de cmo deber cuidarse despus del procedimiento. El mdico tambin podr darle instrucciones ms especficas. El tratamiento ha sido planificado segn las prcticas mdicas actuales, pero en algunos casos pueden ocurrir problemas. Comunquese con el mdico si tiene algn problema o dudas despus del procedimiento. QU ESPERAR DESPUS DEL PROCEDIMIENTO Despus del procedimiento, es normal que la bolsa que contiene los testculos (escroto) est adolorida, hinchada y con hematomas. INSTRUCCIONES PARA EL CUIDADO EN EL HOGAR El bao  Pregntele al mdico cundo puede comenzar a baarse o Social worker.  Si le indicaron que use un soporte deportivo, squeselo cuando se bae. Cuidado de la incisin  Siga las indicaciones del mdico acerca del cuidado de la incisin. Haga lo siguiente: ? Lvese las manos con agua y jabn antes de Quarry manager las vendas (vendaje). Use desinfectante para manos si no dispone de [place]. ? Cambie el vendaje como se lo haya indicado el mdico. ? No retire los puntos (suturas).  Controle la incisin y el escroto todos los das para detectar signos de infeccin. Est atento a los siguientes signos: ? Aumento del enrojecimiento, la hinchazn o Conservation officer, historic buildings. ? Hemorragias o secreciones. ? Calor. ? Pus o mal olor. Control del dolor, la rigidez y la hinchazn  Si se lo indican, aplique hielo sobre la zona lesionada: ? Field seismologist hielo en una bolsa plstica. ? Coloque una [employer] piel y la bolsa de hielo. ? Coloque el hielo durante 73minutos, 2 a 3veces por da. Conducir  No conduzca durante 24horas si le administraron un sedante.  No conduzca ni opere maquinaria pesada mientras toma analgsicos recetados.  Pregntele al mdico cundo es seguro volver a Forensic psychologist. Actividad  No realice ninguna  actividad que demande un gran esfuerzo y Teacher, early years/pre (que sean intensas) por el tiempo que le haya indicado el mdico.  Reanude sus actividades normales como se lo haya indicado el mdico. Pregntele al mdico qu actividades son seguras para usted.  No levante objetos que pesen ms de 10libras (4,5kg) hasta que el mdico le diga que es seguro. Instrucciones generales  [employer] de venta libre y los recetados solamente como se lo haya indicado el mdico.  Consulting civil engineer a todas las visitas de control como se lo haya indicado el mdico. Esto es importante.  Si le indicaron que use un soporte deportivo, selo como se lo haya indicado el mdico.  Si le colocaron un drenaje durante el procedimiento, deber volver para que se lo retiren. SOLICITE ATENCIN MDICA SI:  El dolor empeora.  Tiene ms enrojecimiento, hinchazn o dolor alrededor del escroto.  Observa lquido o sangre que salen del escroto.  La incisin est caliente al tacto.  Tiene pus o percibe mal olor que proviene del escroto.  Tiene fiebre. Esta informacin no tiene Marine scientist el consejo del mdico. Asegrese de hacerle al mdico cualquier pregunta que tenga. Document Released: 05/15/2015 Elsevier Interactive Patient Education  2017 [place] AMBULATORIA       Instruccionnes de alta    Date Toma Copier)    1.  Las drogas que se Statistician en su cuerpo [employer], asi      que por las proximas 24 horas usted no debe:   Conducir Scientist, research (medical)) un automovil   Hacer ninguna decision legal   Tomar ninguna bebida alcoholica  2.  A) [place] puede comenzar una dieta  regular.  Es mejor que hoy empiece con                    liquidos y gradualmente anada comidas solidas.       B) Puede comer cualquier comida que desee pero es mejor empezar con liquidos,               luego sopitas con galletas saladas y gradualmente llegar a las comidas solidas.  3.  Por favor avise a su medico  inmediatamente si usted tiene algun sangrado anormal,       tiene dificultad con la respiracion, enrojecimiento y Social research officer, government en el sitio de la cirugia,     Star, fiebro o dolor que se alivia con [place].  4.  A) Su visita posoperatoria (despues de su operacion) es con el                 B)  Por favor llame para hacer la cita posoperatoria.  5.  Istrucciones especificas :

## 2017-04-01 NOTE — H&P (View-Only) (Signed)
Outpatient Surgical Follow Up  03/06/2017  Jamorian Dimaria Maryjean Morn is an 61 y.o. male.   Chief Complaint  Patient presents with  . Follow-up    Umbilical Pain/Go over MRCP, ECHO and labs    HPI: Azavion's following up after a symptomatic umbilical hernia and symptomatic cholelithiasis. I have obtained a consultation with urology regarding his hydrocele and a recommend surgical intervention. I have also personally reviewed his CT scan once again showing evidence of cholelithiasis and umbilical hernia. I have also reviewed his echocardiogram that shows preserved ejection fraction   Past Medical History:  Diagnosis Date  . Cardiomegaly   . Collagen vascular disease ([place])   . Hypertension     Past Surgical History:  Procedure Laterality Date  . NO PAST SURGERIES      Family History  Problem Relation Age of Onset  . Arthritis Mother   . Prostate cancer Neg Hx   . Bladder Cancer Neg Hx   . Kidney cancer Neg Hx     Social History:  reports that he has never smoked. He has never used smokeless tobacco. He reports that he drinks alcohol. He reports that he does not use drugs.  Allergies: No Known Allergies  Medications reviewed.    ROS Full ROS performed and is otherwise negative other than what is stated in HPI   BP (!) 146/77   Pulse 77   Temp 98.3 F (36.8 C) (Oral)   Ht 5\' 4"  (1.626 m)   Wt 97.5 kg (215 lb)   BMI 36.90 kg/m   Physical Exam  Constitutional: He is oriented to person, place, and time and well-developed, well-nourished, and in no distress. No distress.  Neck: Normal range of motion. No JVD present.  Cardiovascular: Normal rate, regular rhythm and normal heart sounds.   Pulmonary/Chest: Effort normal and breath sounds normal. No stridor. No respiratory distress. He has no wheezes. He has no rales. He exhibits no tenderness.  Abdominal: Soft. He exhibits no distension and no mass. There is no rebound and no guarding.  Tender but reducible UH, no peritonitis   Musculoskeletal: Normal range of motion.  Neurological: He is alert and oriented to person, place, and time. Gait normal. GCS score is 15.  Skin: Skin is warm and dry.  Psychiatric: Mood, memory, affect and judgment normal.  Nursing note and vitals reviewed.   Assessment/Plan: Symptomatic cholelithiasis in addition to symptomatic umbilical hernia. Discussed with the patient in detail about my recommendation for U. hernia repair and laparoscopic vasectomy at the same operative setting. And the question will become whether or not mesh will be needed. I discussed with him that this will be an intraoperative decision and if I think that the defect is greater than 2 cm with a we'll put prostatic mesh in him. We'll also coordinate with Dr. Erlene Quan and so she can perform the hydrocele surgery at the same time. Extensive counseling provided. Greater than 50% of the 25 minutes  visit was spent in counseling/coordination of care   Caroleen Hamman, MD [place] [facility]

## 2017-04-01 NOTE — Anesthesia Postprocedure Evaluation (Signed)
Anesthesia Post Note  Patient: George Douglas  Procedure(s) Performed: HERNIA REPAIR UMBILICAL ADULT (N/A ) LAPAROSCOPIC CHOLECYSTECTOMY (N/A ) EXCISION HYDROCELE (Left )  Patient location during evaluation: PACU Anesthesia Type: General Level of consciousness: awake Pain management: pain level controlled Vital Signs Assessment: post-procedure vital signs reviewed and stable Respiratory status: spontaneous breathing Cardiovascular status: stable Anesthetic complications: no     Last Vitals:  Vitals:   04/01/17 0803 04/01/17 1345  BP: 140/82 (!) 150/95  Pulse: 73 78  Resp: 18 17  Temp: 36.8 C 36.8 C  SpO2: 95% 100%    Last Pain:  Vitals:   04/01/17 0803  TempSrc: Oral                 VAN STAVEREN,Mata Rowen

## 2017-04-01 NOTE — Anesthesia Procedure Notes (Signed)
Procedure Name: Intubation Date/Time: 04/01/2017 10:34 AM Performed by: Allean Found Pre-anesthesia Checklist: Patient identified, Emergency Drugs available, Suction available, Patient being monitored and Timeout performed Patient Re-evaluated:Patient Re-evaluated prior to induction Oxygen Delivery Method: Circle system utilized Preoxygenation: Pre-oxygenation with 100% oxygen Induction Type: IV induction Ventilation: Mask ventilation without difficulty Laryngoscope Size: McGraph and 4 Grade View: Grade II Tube type: Oral Tube size: 7.5 mm Number of attempts: 1 Airway Equipment and Method: Stylet Placement Confirmation: ETT inserted through vocal cords under direct vision,  positive ETCO2 and breath sounds checked- equal and bilateral

## 2017-04-01 NOTE — Op Note (Signed)
Laparoscopic Cholecystectomy  Pre-operative Diagnosis: Chronic cholecystitis and Umbilical hernia  Post-operative Diagnosis: same  Procedure: Laparoscopic cholecystectomy and umbilical hernia repair  Surgeon: Caroleen Hamman, George Douglas George Douglas  Anesthesia: Gen. with endotracheal tube   Findings: Chronic Cholecystitis w hydrops   Estimated Blood Loss: 100cc         Drains: 19 FR RUQ         Specimens: Gallbladder           Complications: none   Procedure Details  The patient was seen again in the Holding Room. The benefits, complications, treatment options, and expected outcomes were discussed with the patient. The risks of bleeding, infection, recurrence of symptoms, failure to resolve symptoms, bile duct damage, bile duct leak, retained common bile duct stone, bowel injury, any of which could require further surgery and/or ERCP, stent, or papillotomy were reviewed with the patient. The likelihood of improving the patient's symptoms with return to their baseline status is good.  The patient and/or family concurred with the proposed plan, giving informed consent.  The patient was taken to Operating Room, identified as George Douglas and the procedure verified as Laparoscopic Cholecystectomy.  A Time Out was held and the above information confirmed.  Prior to the induction of general anesthesia, antibiotic prophylaxis was administered. VTE prophylaxis was in place. General endotracheal anesthesia was then administered and tolerated well. After the induction, the abdomen was prepped with Chloraprep and draped in the sterile fashion. The patient was positioned in the supine position. Subumbilical incision was created and the hernia sac was identified. The edges were cleaned and the hernia sac was excised. There was evidence of preperitoneal fat. We placed Ethibond stay sutures in the standard fashion. The peritoneal cavity was entered under direct visualization and a Hasson trochar was placed.  Pneumoperitoneum was then created with CO2 and tolerated well without any adverse changes in the patient's vital signs.  Three 5-mm ports were placed in the right upper quadrant all under direct vision. All skin incisions  were infiltrated with a local anesthetic agent before making the incision and placing the trocars.   The patient was positioned  in reverse Trendelenburg, tilted slightly to the patient's left.  The gallbladder was identified, the fundus grasped and retracted cephalad. Adhesions were lysed bluntly.  We had to place a small hole within the gallbladder to decompress it and we obtained hydrops. There was significant chronic inflammatory response and we found also that the gallbladder was intrahepatic  The infundibulum was grasped and retracted laterally, exposing the peritoneum overlying the triangle of Calot. This was then divided and exposed in a blunt fashion. An extended critical view of the cystic duct and cystic artery was obtained.  The cystic duct was clearly identified and bluntly dissected.   Artery and duct were double clipped and divided.The gallbladder was taken from the gallbladder fossa in a retrograde fashion with the electrocautery. The gallbladder was removed and placed in an Endocatch bag.  Please note that we perform very careful dissection and there was some raw liver edges within the gallbladder fossa  The liver bed was irrigated and inspected. Hemostasis was achieved with the electrocautery.  Copious irrigation was utilized and was repeatedly aspirated until clear.  The gallbladder and Endocatch sac were then removed through a port site.   Inspection of the right upper quadrant was performed. No bleeding, bile duct injury or leak, or bowel injury was noted. Pneumoperitoneum was released.  [place] drain was placed in the standard fashion and  secured to the abdominal wall with 3-0 nylon was After all the lap or scopic ports were removed and we repaired the  umbilical hernia with multiple interrupted Ethibond sutures in the standard fashion.  4-0 subcuticular Monocryl was used to close the skin. Dermabond was  applied.  The patient was then extubated and brought to the recovery room in stable condition. Sponge, lap, and needle counts were correct at closure and at the conclusion of the case.               Caroleen Hamman, George Douglas, George Douglas

## 2017-04-01 NOTE — Interval H&P Note (Signed)
History and Physical Interval Note:  04/01/2017 8:59 AM  George Douglas  has presented today for surgery, with the diagnosis of UMBILICAL HERNIA  The various methods of treatment have been discussed with the patient and family. After consideration of risks, benefits and other options for treatment, the patient has consented to  Procedure(s): HERNIA REPAIR UMBILICAL ADULT (Left) LAPAROSCOPIC CHOLECYSTECTOMY (N/A) EXCISION HYDROCELE (N/A) as a surgical intervention .  The patient's history has been reviewed, patient examined, no change in status, stable for surgery.  I have reviewed the patient's chart and labs.  Questions were answered to the patient's satisfaction.     [place]

## 2017-04-02 ENCOUNTER — Encounter: Payer: Self-pay | Admitting: Surgery

## 2017-04-02 NOTE — Op Note (Signed)
Date of procedure: 04/02/17  Preoperative diagnosis:  1. Left hydrocele   Postoperative diagnosis:  1. Same as above   Procedure: 1. Left hydrocelectomy  Surgeon: Hollice Espy, MD  Anesthesia: General  Complications: None  Intraoperative findings: 100 cc of straw-colored fluid drained from left hydrocele sac. Edges the sac excised and oversewn.  EBL: Minimal  Specimens: None  Drains: None  Indication: George Douglas is a 61 y.o. patient with a symptomatic left hydrocele is also undergoing cholecystectomy and umbilical hernia repair with Dr. Perrin Maltese today. .  After reviewing the management options for treatment, he elected to proceed with the above surgical procedure(s). We have discussed the potential benefits and risks of the procedure, side effects of the proposed treatment, the likelihood of the patient achieving the goals of the procedure, and any potential problems that might occur during the procedure or recuperation. Informed consent has been obtained.  Description of procedure:  The patient was taken to the operating room and general anesthesia was induced.  The patient was placed in the supine position, prepped and draped in the usual sterile fashion, and preoperative antibiotics were administered. A preoperative time-out was performed.   At this point time, the laparoscopic cholecystectomy and hernia repair. Performed by Dr. Dahlia Byes. Once this procedure was completed, I was called into the room and the patient was reprepped and draped in the timeout protocol was performed for my particular procedure.  A planned incision was marked over the left hemiscrotum along Langerhans lines, approximately 6 cm in length.  The site was injected with 10 cc of half Marcaine and half lidocaine solution for local anesthetic. A 15 blade was used to incise the skin. The subcutaneous tissues were opened using Bovie electrocautery. The hydrocele sac was both sharply and grossly dissected out  and delivered into the field. An incision was created in the hydrocele sac and approximately 100 cc of straw-colored fluid was drained. The edges of the hydrocele sac were widely opened and excised. There is no obvious pathology therefore the decision was made to defer pathologic analysis of the hydrocele sac as this appeared quite benign. The edges of the excised hydrocele sac were oversewn using a running 3-0 Vicryl suture for the purpose of hemostasis. Careful hemostasis was achieved both in the scrotal sac as well as on the edge of the excised hydrocele sac. Once completed, the testicle was delivered back into the field. Copious irrigation was performed. Care was taken to ensure that the testicle was placed back into its appropriate anatomic position. The dartos layer was then closed using a running 3-0 Vicryl suture. Skin was then closed using simple interrupted 4 chromic sutures. Additionally, Dermabond was applied. Scrotal fluffs and scrotal support devices were applied. The patient was then reversed from anesthesia, and taken to the PACU in stable condition.  Plan: Patient will follow up in 3-4 weeks for wound check. Postoperative instructions were reviewed prior to the procedure with the patient and postoperative with the patient's family.  Hollice Espy, M.D.

## 2017-04-03 ENCOUNTER — Telehealth: Payer: Self-pay

## 2017-04-03 LAB — SURGICAL PATHOLOGY

## 2017-04-03 NOTE — Telephone Encounter (Signed)
Post-op call made to patient at this time. Spoke with patient. Post-op interview questions below.  1. How are you feeling? Good  2. Is your pain controlled? Yes  3. What are you doing for the pain? Taking the pain medication as needed.  4. Are you having any Nausea or Vomiting? No  5. Are you having any Fever or Chills? No  6. Are you having any Constipation or Diarrhea? No  7. Is there any Swelling or Bruising you are concerned about? No  8. Do you have any questions or concerns at this time? Not at this time   Discussion: Told patient that it was okay for [place] to see him until next week (04/10/2017) with his JP drain. Patient agreed.

## 2017-04-10 ENCOUNTER — Encounter: Payer: Self-pay | Admitting: Surgery

## 2017-04-10 ENCOUNTER — Ambulatory Visit (INDEPENDENT_AMBULATORY_CARE_PROVIDER_SITE_OTHER): Payer: BLUE CROSS/BLUE SHIELD | Admitting: Surgery

## 2017-04-10 DIAGNOSIS — Z09 Encounter for follow-up examination after completed treatment for conditions other than malignant neoplasm: Secondary | ICD-10-CM

## 2017-04-10 NOTE — Patient Instructions (Addendum)
Por favor llamenos si tiene alguna pregunta.  El Doctor Pabon quiere que deje de tomar el Hydrocodone-acetaminophen. El quiere que tome ALEVE 2 tabletas cada 12 horas. Para la tos, el quiere que tome DAYQUIL cada 4 horas.

## 2017-04-10 NOTE — Progress Notes (Signed)
S/p lap chole and UH repair Less 20cc day from JP + PO, no fevers, abd pain improving Path d/w pt  PE NAD Abd: soft, incisions c/d/i. No infection JP removed.  A/p Doing well No heavy lifting  F/U PRN

## 2017-05-02 ENCOUNTER — Ambulatory Visit (INDEPENDENT_AMBULATORY_CARE_PROVIDER_SITE_OTHER): Payer: BLUE CROSS/BLUE SHIELD | Admitting: Urology

## 2017-05-02 VITALS — BP 134/77 | HR 84 | Ht 66.0 in | Wt 218.5 lb

## 2017-05-02 DIAGNOSIS — N430 Encysted hydrocele: Secondary | ICD-10-CM

## 2017-05-02 NOTE — Progress Notes (Signed)
05/02/2017 9:33 AM   George Douglas 09-26-1955 [id number]  Referring provider: No referring provider defined for this encounter.  No chief complaint on file.   HPI: The patient is a 61 year old gentleman who presents today for one-month follow-up after undergoing a left hydrocelectomy.  At the same time, he underwent concurrent lap chole and umbilical hernia repair with general surgery.  His incisions in his scrotum are healing well.  Notes no drainage or infection at his incision site.  He was expecting his surgery to be fully recovered from in 5-10 days.  He does note persistent swelling though.   PMH: Past Medical History:  Diagnosis Date  . Cardiomegaly   . Collagen vascular disease ([place])   . Hypertension   . Lower extremity edema   . Mass    in throat    Surgical History: Past Surgical History:  Procedure Laterality Date  . CHOLECYSTECTOMY N/A 04/01/2017   Procedure: LAPAROSCOPIC CHOLECYSTECTOMY;  Surgeon: Jules Husbands, MD;  Location: [facility] ORS;  Service: General;  Laterality: N/A;  . HYDROCELE EXCISION Left 04/01/2017   Procedure: EXCISION HYDROCELE;  Surgeon: Hollice Espy, MD;  Location: [facility] ORS;  Service: Urology;  Laterality: Left;  . NO PAST SURGERIES    . UMBILICAL HERNIA REPAIR N/A 04/01/2017   Procedure: HERNIA REPAIR UMBILICAL ADULT;  Surgeon: Jules Husbands, MD;  Location: [facility] ORS;  Service: General;  Laterality: N/A;    Home Medications:  Allergies as of 05/02/2017   No Known Allergies     Medication List        Accurate as of 05/02/17  9:33 AM. Always use your most recent med list.          HYDROcodone-acetaminophen 5-325 MG tablet Commonly known as:  NORCO/VICODIN Take 1-2 tablets by mouth every 6 (six) hours as needed for moderate pain.   lisinopril 5 MG tablet Commonly known as:  PRINIVIL,ZESTRIL Take 5 mg by mouth daily.       Allergies: No Known Allergies  Family History: Family History  Problem Relation Age of Onset    . Arthritis Mother   . Prostate cancer Neg Hx   . Bladder Cancer Neg Hx   . Kidney cancer Neg Hx     Social History:  reports that  has never smoked. he has never used smokeless tobacco. He reports that he drinks about 3.6 oz of alcohol per week. He reports that he does not use drugs.  ROS: UROLOGY Frequent Urination?: No Hard to postpone urination?: No Burning/pain with urination?: No Get up at night to urinate?: No Leakage of urine?: No Urine stream starts and stops?: No Trouble starting stream?: No Do you have to strain to urinate?: No Blood in urine?: No Urinary tract infection?: No Sexually transmitted disease?: No Injury to kidneys or bladder?: No Painful intercourse?: No Weak stream?: No Erection problems?: No Penile pain?: No  Gastrointestinal Nausea?: No Vomiting?: No Indigestion/heartburn?: No Diarrhea?: No Constipation?: No  Constitutional Fever: No Night sweats?: No Weight loss?: No Fatigue?: No  Skin Skin rash/lesions?: No Itching?: No  Eyes Blurred vision?: No Double vision?: No  Ears/Nose/Throat Sore throat?: No Sinus problems?: No  Hematologic/Lymphatic Swollen glands?: No Easy bruising?: No  Cardiovascular Leg swelling?: No Chest pain?: No  Respiratory Cough?: No Shortness of breath?: No  Endocrine Excessive thirst?: No  Musculoskeletal Back pain?: No Joint pain?: No  Neurological Headaches?: No Dizziness?: No  Psychologic Depression?: No Anxiety?: No  Physical Exam: BP 134/77 (BP Location: Right Arm, Patient  Position: Sitting, Cuff Size: Large)   Pulse 84   Ht 5\' 6"  (1.676 m)   Wt 218 lb 8 oz (99.1 kg)   BMI 35.27 kg/m   Constitutional:  Alert and oriented, No acute distress. HEENT: [place] AT, moist mucus membranes.  Trachea midline, no masses. Cardiovascular: No clubbing, cyanosis, or edema. Respiratory: Normal respiratory effort, no increased work of breathing. GI: Abdomen is soft, nontender, nondistended, no  abdominal masses GU: No CVA tenderness.  Patient's left hemiscrotum with incision clean dry and intact.  Patient with expected postoperative edema.  No sign of infection.  Overall he is healing well from his hydrocelectomy. Skin: No rashes, bruises or suspicious lesions. Lymph: No cervical or inguinal adenopathy. Neurologic: Grossly intact, no focal deficits, moving all 4 extremities. Psychiatric: Normal mood and affect.  Laboratory Data: Lab Results  Component Value Date   WBC 5.4 02/03/2017   HGB 14.9 02/03/2017   HCT 43.4 02/03/2017   MCV 92.9 02/03/2017   PLT 164 02/03/2017    Lab Results  Component Value Date   CREATININE 0.77 02/03/2017    No results found for: PSA  No results found for: TESTOSTERONE  No results found for: HGBA1C  Urinalysis    Component Value Date/Time   COLORURINE STRAW (A) 02/03/2017 2357   APPEARANCEUR Clear 02/24/2017 0845   LABSPEC 1.004 (L) 02/03/2017 2357   PHURINE 7.0 02/03/2017 2357   GLUCOSEU Negative 02/24/2017 0845   HGBUR NEGATIVE 02/03/2017 2357   BILIRUBINUR Negative 02/24/2017 0845   KETONESUR NEGATIVE 02/03/2017 2357   PROTEINUR Negative 02/24/2017 0845   PROTEINUR NEGATIVE 02/03/2017 2357   NITRITE Negative 02/24/2017 0845   NITRITE NEGATIVE 02/03/2017 2357   LEUKOCYTESUR Negative 02/24/2017 0845    Assessment & Plan:    1. Left hydrocele Patient doing well after left hydrocelectomy.  I reassured him that he is doing well after surgery.  His postoperative edema is where I would expect to be for being 4 weeks from surgery.  I have recommended NSAID therapy for edema and discomfort.  He was informed that particular for 6 weeks before the swelling is resolved. He can follow-up with [place] as needed.  Return if symptoms worsen or fail to improve.  Nickie Retort, MD  [facility] [address] [place], [place] [address] [phone]

## 2019-01-18 IMAGING — CT CT ABD-PELV W/ CM
2 of 5 series · 16 of 46 positions shown, 18 images · IV contrast (APPLIED)
Comparison: Scrotal sonogram August 02, 2014

CLINICAL DATA: Abdominal distension. Acute on chronic periumbilical
pain.

EXAM:
CT ABDOMEN AND PELVIS WITH CONTRAST
TECHNIQUE: Multidetector CT imaging of the abdomen and pelvis was performed
using the standard protocol following bolus administration of
intravenous contrast.
CONTRAST:  100mL YPEKE9-GII IOPAMIDOL (YPEKE9-GII) INJECTION 61%

[Series 2: routine abd/pel with · axial · 0.85mm/px · z∈[-1012,-597]mm · 13 of 95 slices shown, 15 images]
[im 6/95  soft-tissue]
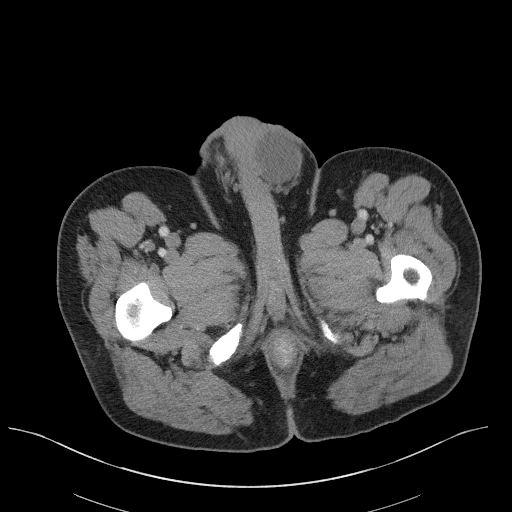
[im 6/95  bone]
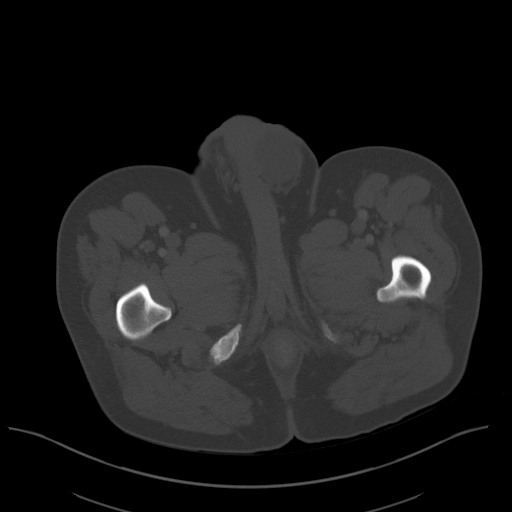
[im 12/95  soft-tissue]
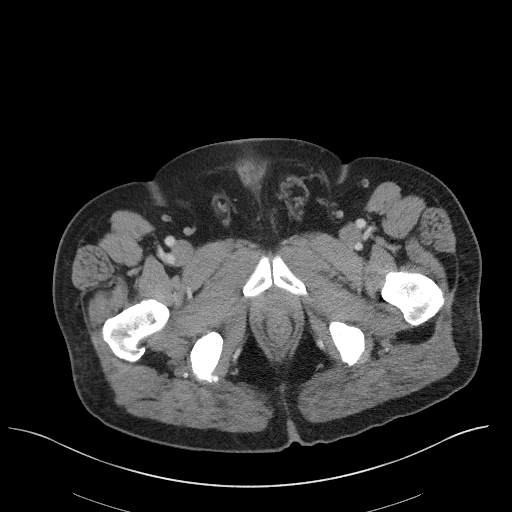
[im 23/95  soft-tissue]
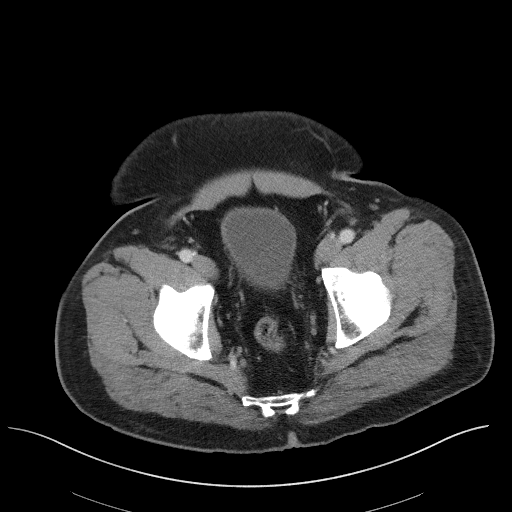
[im 28/95  soft-tissue]
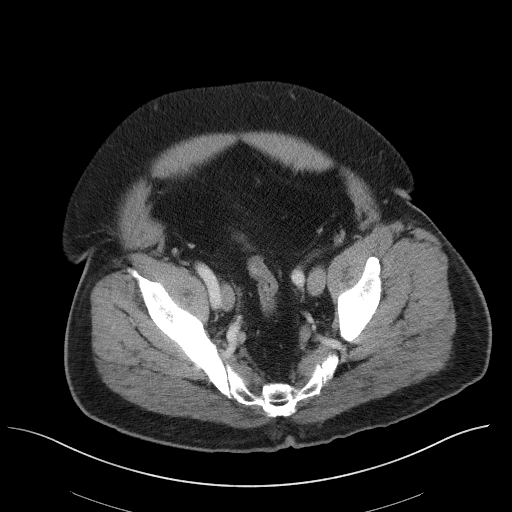
[im 34/95  soft-tissue]
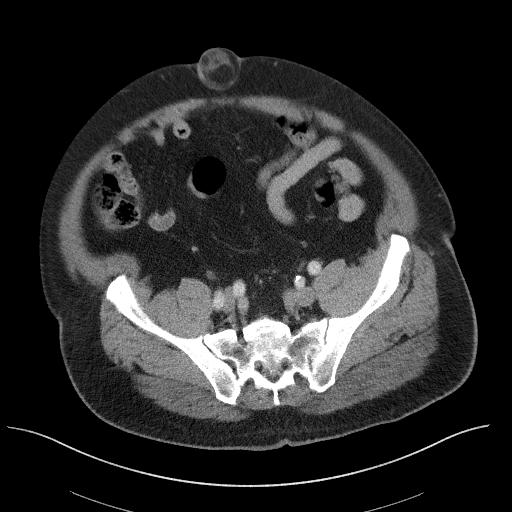
[im 39/95  soft-tissue]
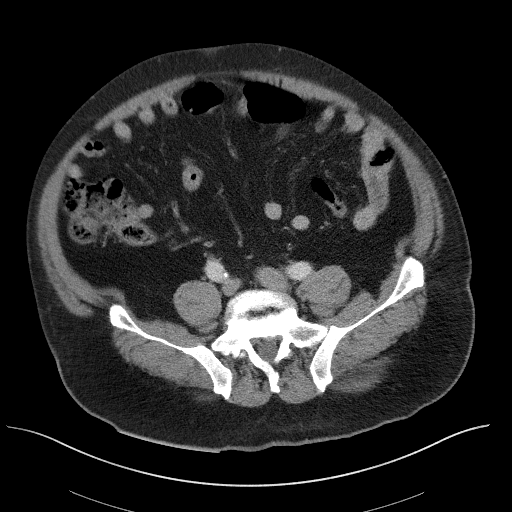
[im 50/95  soft-tissue]
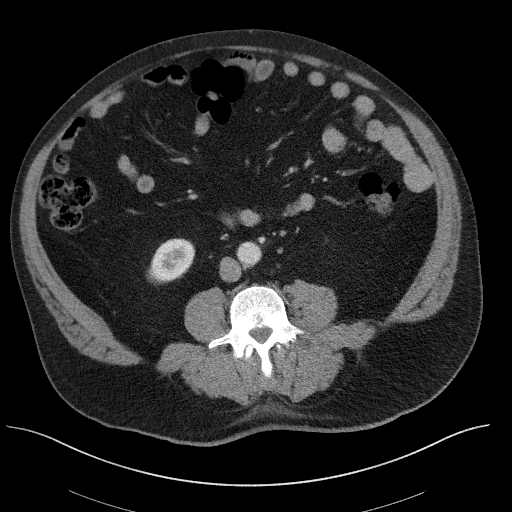
[im 56/95  soft-tissue]
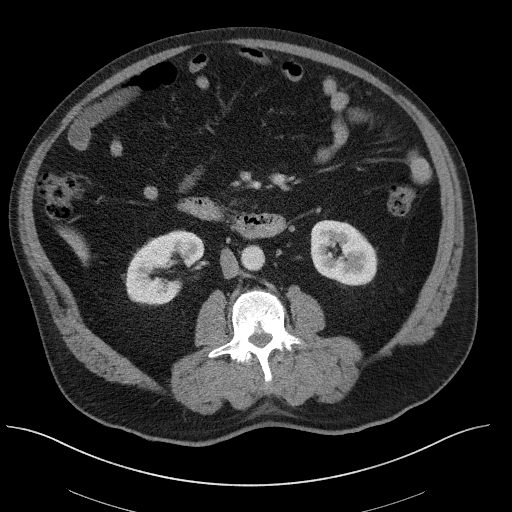
[im 61/95  soft-tissue]
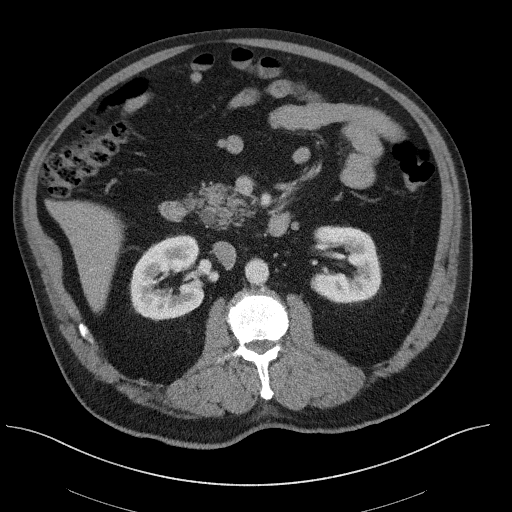
[im 61/95  bone]
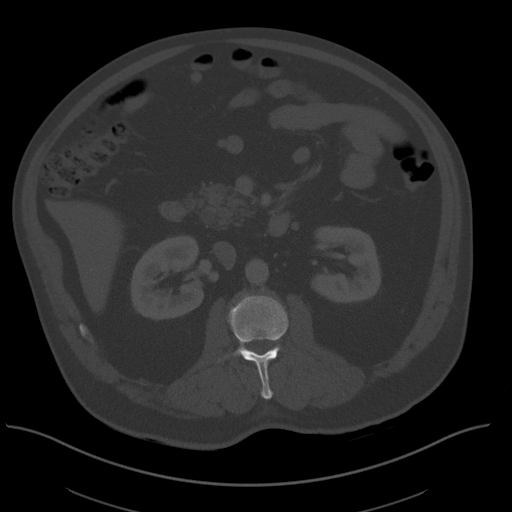
[im 67/95  soft-tissue]
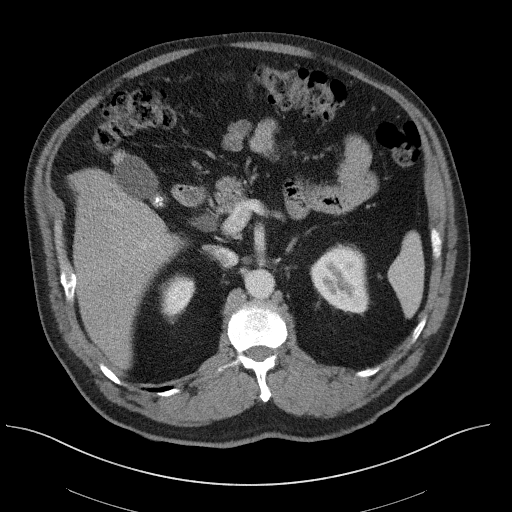
[im 72/95  soft-tissue]
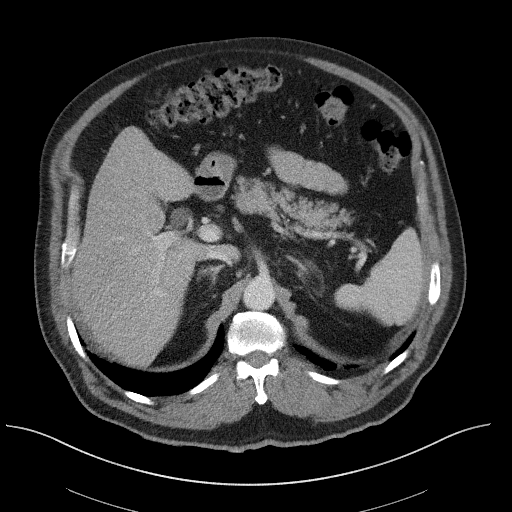
[im 83/95  soft-tissue]
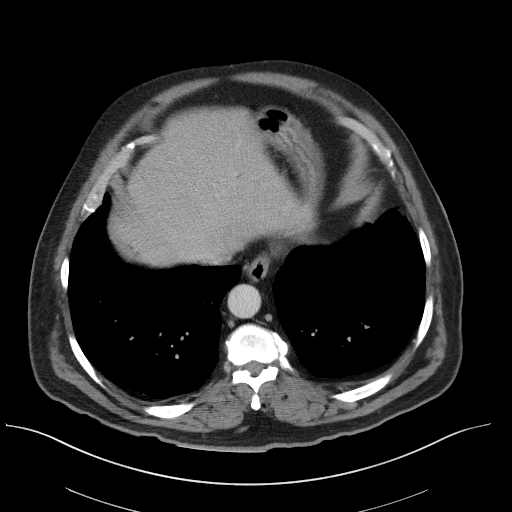
[im 89/95  soft-tissue]
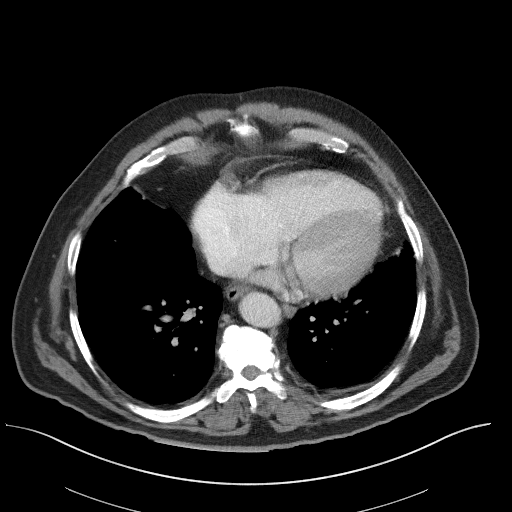

[Series 5: coronal st · coronal · 0.77mm/px · 3 of 118 slices shown]
[im 40/118  soft-tissue]
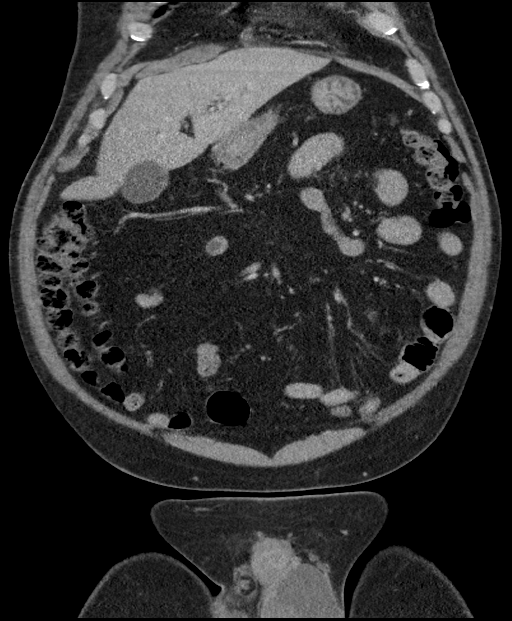
[im 53/118  soft-tissue]
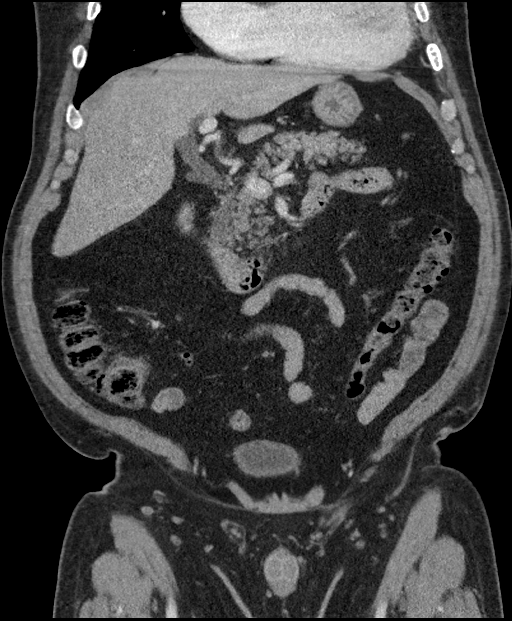
[im 66/118  soft-tissue]
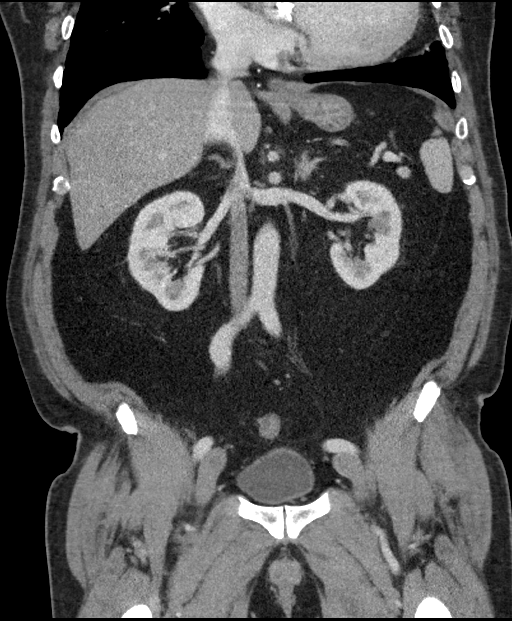

[16 of 46 positions shown; findings below may reference images not displayed]

FINDINGS: LOWER CHEST: Pleural thickening LEFT lung base. The included heart
is moderately enlarged. No pericardial effusions.

HEPATOBILIARY: Numerous tiny stones within the gallbladder neck
versus proximal cystic duct. No CT findings of acute cholecystitis.
Normal CT liver.

PANCREAS: Normal.

SPLEEN: Normal.

ADRENALS/URINARY TRACT: Kidneys are orthotopic, demonstrating
symmetric enhancement. Focal cortical scarring RIGHT interpolar
kidney. No nephrolithiasis, hydronephrosis or solid renal masses.
The unopacified ureters are normal in course and caliber. Delayed
imaging through the kidneys demonstrates symmetric prompt contrast
excretion within the proximal urinary collecting system. Urinary
bladder is partially distended and unremarkable. Normal adrenal
glands.

STOMACH/BOWEL: The stomach, small and large bowel are normal in
course and caliber without inflammatory changes. Normal appendix.

VASCULAR/LYMPHATIC: Aortoiliac vessels are normal in course and
caliber. Mild calcific atherosclerosis. No lymphadenopathy by CT
size criteria.

REPRODUCTIVE: Partially imaged LEFT scrotal hydrocele. Prostate size
is normal.

OTHER: No intraperitoneal free fluid or free air.

MUSCULOSKELETAL: Nonacute. 3.2 cm neck small to moderate umbilical
hernia containing fat and inflammatory changes. No bowel within
hernia sac. Mild L5-S1 degenerative disc with vacuum phenomena.
IMPRESSION: 1. Small to moderate fat containing umbilical hernia with fat
necrosis.
2. Cholelithiasis versus choledocholithiasis without acute
cholecystitis.
3. Moderate cardiomegaly.  Recommend nonemergent chest radiograph.
Aortic Atherosclerosis (G0L3S-1II.I).
